# Patient Record
Sex: Female | Born: 1985 | Race: Black or African American | Hispanic: No | Marital: Single | Smoking: Never smoker
Health system: Southern US, Community
[De-identification: ages and names within clinical notes are randomized; demographics above are authoritative.]

## PROBLEM LIST (undated history)

## (undated) DIAGNOSIS — F319 Bipolar disorder, unspecified: Secondary | ICD-10-CM

## (undated) DIAGNOSIS — F329 Major depressive disorder, single episode, unspecified: Secondary | ICD-10-CM

## (undated) DIAGNOSIS — F909 Attention-deficit hyperactivity disorder, unspecified type: Secondary | ICD-10-CM

## (undated) DIAGNOSIS — F429 Obsessive-compulsive disorder, unspecified: Secondary | ICD-10-CM

## (undated) DIAGNOSIS — J45909 Unspecified asthma, uncomplicated: Secondary | ICD-10-CM

## (undated) DIAGNOSIS — F70 Mild intellectual disabilities: Secondary | ICD-10-CM

## (undated) DIAGNOSIS — R569 Unspecified convulsions: Secondary | ICD-10-CM

## (undated) DIAGNOSIS — K219 Gastro-esophageal reflux disease without esophagitis: Secondary | ICD-10-CM

## (undated) DIAGNOSIS — F32A Depression, unspecified: Secondary | ICD-10-CM

## (undated) DIAGNOSIS — Q909 Down syndrome, unspecified: Secondary | ICD-10-CM

## (undated) HISTORY — PX: EXAMINATION UNDER ANESTHESIA: SHX1540

## (undated) HISTORY — DX: Mild intellectual disabilities: F70

## (undated) HISTORY — DX: Down syndrome, unspecified: Q90.9

## (undated) HISTORY — DX: Attention-deficit hyperactivity disorder, unspecified type: F90.9

---
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
## 2006-10-14 ENCOUNTER — Ambulatory Visit (HOSPITAL_COMMUNITY): Admission: RE | Admit: 2006-10-14 | Discharge: 2006-10-14 | Payer: Self-pay | Admitting: Obstetrics & Gynecology

## 2006-12-14 ENCOUNTER — Emergency Department (HOSPITAL_COMMUNITY): Admission: EM | Admit: 2006-12-14 | Discharge: 2006-12-14 | Payer: Self-pay | Admitting: Family Medicine

## 2007-01-10 ENCOUNTER — Ambulatory Visit: Payer: Self-pay | Admitting: Obstetrics & Gynecology

## 2007-01-31 ENCOUNTER — Ambulatory Visit: Payer: Self-pay | Admitting: *Deleted

## 2007-02-04 ENCOUNTER — Ambulatory Visit (HOSPITAL_COMMUNITY): Admission: RE | Admit: 2007-02-04 | Discharge: 2007-02-04 | Payer: Self-pay | Admitting: Obstetrics & Gynecology

## 2007-02-04 ENCOUNTER — Encounter (INDEPENDENT_AMBULATORY_CARE_PROVIDER_SITE_OTHER): Payer: Self-pay | Admitting: Specialist

## 2007-02-04 ENCOUNTER — Ambulatory Visit: Payer: Self-pay | Admitting: Obstetrics & Gynecology

## 2007-06-06 ENCOUNTER — Ambulatory Visit: Payer: Self-pay | Admitting: Gynecology

## 2008-09-23 ENCOUNTER — Ambulatory Visit (HOSPITAL_COMMUNITY): Admission: RE | Admit: 2008-09-23 | Discharge: 2008-09-23 | Payer: Self-pay | Admitting: Obstetrics & Gynecology

## 2008-09-23 ENCOUNTER — Encounter: Payer: Self-pay | Admitting: Obstetrics & Gynecology

## 2008-09-23 ENCOUNTER — Ambulatory Visit: Payer: Self-pay | Admitting: Obstetrics & Gynecology

## 2009-10-28 ENCOUNTER — Emergency Department (HOSPITAL_COMMUNITY): Admission: EM | Admit: 2009-10-28 | Discharge: 2009-10-28 | Payer: Self-pay | Admitting: Emergency Medicine

## 2009-11-08 ENCOUNTER — Emergency Department (HOSPITAL_COMMUNITY): Admission: EM | Admit: 2009-11-08 | Discharge: 2009-11-08 | Payer: Self-pay | Admitting: Family Medicine

## 2011-02-05 LAB — CBC
HCT: 38.4 % (ref 36.0–46.0)
Hemoglobin: 13 g/dL (ref 12.0–15.0)
MCHC: 33.8 g/dL (ref 30.0–36.0)
MCV: 94.5 fL (ref 78.0–100.0)
Platelets: ADEQUATE 10*3/uL (ref 150–400)
RBC: 4.06 MIL/uL (ref 3.87–5.11)
RDW: 14.2 % (ref 11.5–15.5)
WBC: 8.1 10*3/uL (ref 4.0–10.5)

## 2011-02-05 LAB — DIFFERENTIAL
Basophils Absolute: 0 10*3/uL (ref 0.0–0.1)
Eosinophils Absolute: 0 10*3/uL (ref 0.0–0.7)
Lymphs Abs: 1.8 10*3/uL (ref 0.7–4.0)
Monocytes Absolute: 0.3 10*3/uL (ref 0.1–1.0)
Neutrophils Relative %: 74 % (ref 43–77)
WBC Morphology: INCREASED

## 2011-02-05 LAB — POCT I-STAT, CHEM 8
Calcium, Ion: 0.95 mmol/L — ABNORMAL LOW (ref 1.12–1.32)
Hemoglobin: 14.3 g/dL (ref 12.0–15.0)
Sodium: 136 mEq/L (ref 135–145)
TCO2: 30 mmol/L (ref 0–100)

## 2011-02-05 LAB — POTASSIUM: Potassium: 4.1 mEq/L (ref 3.5–5.1)

## 2011-02-16 ENCOUNTER — Encounter (INDEPENDENT_AMBULATORY_CARE_PROVIDER_SITE_OTHER): Payer: Medicaid Other | Admitting: Family

## 2011-02-16 DIAGNOSIS — Z3040 Encounter for surveillance of contraceptives, unspecified: Secondary | ICD-10-CM

## 2011-02-17 NOTE — Progress Notes (Signed)
NAME:  Hilario, Makaleigh             ACCOUNT NO.:  [id number]  MEDICAL RECORD NO.:  [id number]           PATIENT TYPE:  A  LOCATION:  [facility]                   FACILITY:  [facility]  PHYSICIAN:  Sid Falcon, CNM  DATE OF BIRTH:  1986/09/06  DATE OF SERVICE:  02/16/2011                                 CLINIC NOTE  REASON FOR VISIT:  The patient is here for refill of Jolessa (oral contraceptive).  It is documented in the chart that the patient has been trying to obtain this, has been unsuccessful, so scheduled an appointment to get a prescription.  The patient's last Pap smear was in November 2009.  Due to the complications of Pap smear and the fact that she has a negative HPV high risk.  I was decided that she able to have Paps q.3 years.  Next Pap smear will be due in November 2012 which needs to be completed in the OR due to mental status (developmentally delayed).  PLAN:  The patient's prescription for Jolessa 1 p.o. daily continuously written.  The patient instructed to start with information given to the staff worker, began pills on Sunday and just takes 1 pill daily.  Backup plans are not necessary due to statement by the patient is not sexually active and is to provided in the group home.     Sid Falcon, CNM    WM/MEDQ  D:  02/16/2011  T:  02/16/2011  Job:  [id number]

## 2011-03-20 NOTE — Op Note (Signed)
NAME:  Margaret Cooper, Margaret Cooper             ACCOUNT NO.:  [id number]   MEDICAL RECORD NO.:  [id number]          PATIENT TYPE:  AMB   LOCATION:  [facility]                           FACILITY:  [facility]   PHYSICIAN:  Lesly Dukes, M.D. DATE OF BIRTH:  [birth year]   DATE OF PROCEDURE:  DATE OF DISCHARGE:                               OPERATIVE REPORT   PREOPERATIVE DIAGNOSIS:  The patient is a 25 year old female in need of  a Pap smear, unable to tolerate exam in the office due to mental  retardation.   POSTOPERATIVE DIAGNOSIS:  The patient is a 25 year old female in need of  a Pap smear, unable to tolerate exam in the office due to mental  retardation.   PROCEDURE:  Pap smear with HPV typing.   SURGEON:  Lesly Dukes, MD   ANESTHESIA:  MAC.   FINDINGS:  Small nulliparous cervix, virginal introitus, small uterus,  no masses.   SPECIMENS:  1. Pap smear plus HPV typing.  2. Cytology.   ESTIMATED BLOOD LOSS:  None.   COMPLICATIONS:  None.   PROCEDURE:  After informed consent was obtained from her mother, the  patient was taken to the operating room and MAC anesthesia was induced.  The patient was placed in dorsal lithotomy position and a Pap smear was  obtained with the spatula and brush.  Bimanual was then performed with  the above findings.  The patient tolerated the procedure well, went to  the recovery room in stable condition.  All counts correct x2.      Lesly Dukes, M.D.  Electronically Signed     KHL/MEDQ  D:  09/23/2008  T:  09/24/2008  Job:  [id number]

## 2011-03-23 NOTE — Op Note (Signed)
NAME:  Margaret Cooper, Margaret Cooper             ACCOUNT NO.:  [id number]   MEDICAL RECORD NO.:  [id number]          PATIENT TYPE:  AMB   LOCATION:                                [facility]ACILITY[facility]  [facility]   PHYSICIAN:  Allie Bossier, MD        DATE OF BIRTH:  July 20, 1986   DATE OF PROCEDURE:  DATE OF DISCHARGE:  02/04/2007                               OPERATIVE REPORT   PREOPERATIVE DIAGNOSIS:  Need for Pap smear and breast examination,  mental retardation with Down's.   POSTOPERATIVE DIAGNOSIS:  Need for Pap smear and breast examination,  mental retardation with Down's.   PROCEDURE:  Examination under anesthesia, breast exam, and Pap smear.   SURGEON:  Allie Bossier, M.D.   ANESTHESIA:  MAC, Raul Del, M.D.   COMPLICATIONS:  None.   ESTIMATED BLOOD LOSS:  None.   SPECIMENS:  Pap smear.   PERTINENT FINDINGS:  Virginal introitus.  Negative bimanual exam.   DESCRIPTION OF PROCEDURE/FINDINGS:  Consents were signed by the  appropriate people.  She was given some preliminary IV sedation in the  preop area and in the operating room.  MAC anesthesia was given.  She  was placed in the dorsal lithotomy position.  Her vulva was inspected  and noted to be normal and virginal.  Using a small Pederson speculum,  the cervix was visualized.  Pap smear was obtained.  No visible lesions  were noted.  A bimanual exam was negative for any masses.  Her uterus  was normal in size and shape.  A breast exam was then done.  She was  awakened from her anesthesia and taken to the recovery room in stable  condition.      Allie Bossier, MD  Electronically Signed     MCD/MEDQ  D:  02/04/2007  T:  02/04/2007  Job:  [id number]

## 2011-08-07 LAB — CBC
MCHC: 33.6
Platelets: 205
RDW: 15.1

## 2011-09-21 ENCOUNTER — Ambulatory Visit (INDEPENDENT_AMBULATORY_CARE_PROVIDER_SITE_OTHER): Payer: Medicaid Other | Admitting: Obstetrics & Gynecology

## 2011-09-21 ENCOUNTER — Encounter: Payer: Self-pay | Admitting: Obstetrics & Gynecology

## 2011-09-21 VITALS — BP 113/72 | HR 101 | Temp 97.7°F | Ht 60.0 in | Wt 183.6 lb

## 2011-09-21 DIAGNOSIS — Z01818 Encounter for other preprocedural examination: Secondary | ICD-10-CM

## 2011-09-21 DIAGNOSIS — Q909 Down syndrome, unspecified: Secondary | ICD-10-CM

## 2011-09-21 NOTE — Progress Notes (Signed)
History:  25 y.o. G0P0000 here today to schedule for pap smear to be done in the OR.  She has Down's syndrome, and has been followed in this clinic for several years.  As of her last visit, it was decided that she will need paps every 3 years in the OR since she has no history of cervical dysplasia.  Last pap in 2009 was negative.  Patient is here with her advocate, Joneen Roach, who reports that the patient reported vulvar irritation but nothing was seen on exam.  She has used OTC antifungals but the patient still reports having pruritus.   No other symptoms reported by the patient or her advocate.  Patient is not sexually active.  The following portions of the patient's history were reviewed and updated as appropriate: allergies, current medications, past family history, past medical history, past social history, past surgical history and problem list.   Objective:  Physical Exam Blood pressure 113/72, pulse 101, temperature 97.7 F (36.5 C), temperature source Oral, height 5' (1.524 m), weight 183 lb 9.6 oz (83.28 kg), last menstrual period 09/07/2011. Exam deferred as per patient preference and limitations  Assessment & Plan:  Will schedule for exam under anesthesia and pap smear in the OR.  Patient will be contacted with date and time of exam. Return to clinic for any gynecologic concerns.

## 2011-09-21 NOTE — Patient Instructions (Signed)

## 2011-10-01 ENCOUNTER — Emergency Department (HOSPITAL_COMMUNITY)
Admission: EM | Admit: 2011-10-01 | Discharge: 2011-10-02 | Disposition: A | Discharge status: Rehabilitation Facility | Payer: Medicaid Other | Attending: Emergency Medicine | Admitting: Emergency Medicine

## 2011-10-01 ENCOUNTER — Encounter (HOSPITAL_COMMUNITY): Payer: Self-pay | Admitting: Emergency Medicine

## 2011-10-01 DIAGNOSIS — F70 Mild intellectual disabilities: Secondary | ICD-10-CM | POA: Insufficient documentation

## 2011-10-01 DIAGNOSIS — R109 Unspecified abdominal pain: Secondary | ICD-10-CM | POA: Insufficient documentation

## 2011-10-01 DIAGNOSIS — F988 Other specified behavioral and emotional disorders with onset usually occurring in childhood and adolescence: Secondary | ICD-10-CM | POA: Insufficient documentation

## 2011-10-01 DIAGNOSIS — Q909 Down syndrome, unspecified: Secondary | ICD-10-CM | POA: Insufficient documentation

## 2011-10-01 DIAGNOSIS — Z79899 Other long term (current) drug therapy: Secondary | ICD-10-CM | POA: Insufficient documentation

## 2011-10-01 DIAGNOSIS — R1110 Vomiting, unspecified: Secondary | ICD-10-CM | POA: Insufficient documentation

## 2011-10-01 LAB — CBC
MCH: 31.1 pg (ref 26.0–34.0)
MCHC: 32.4 g/dL (ref 30.0–36.0)
MCV: 96.1 fL (ref 78.0–100.0)
Platelets: 321 10*3/uL (ref 150–400)
RBC: 4.08 MIL/uL (ref 3.87–5.11)
RDW: 14.9 % (ref 11.5–15.5)

## 2011-10-01 LAB — DIFFERENTIAL
Basophils Absolute: 0.1 10*3/uL (ref 0.0–0.1)
Basophils Relative: 1 % (ref 0–1)
Eosinophils Absolute: 0.1 10*3/uL (ref 0.0–0.7)
Eosinophils Relative: 1 % (ref 0–5)
Neutrophils Relative %: 48 % (ref 43–77)

## 2011-10-01 NOTE — ED Notes (Signed)
Caregiver reports pt.s' abdominal pain and vomitting this evening .

## 2011-10-02 ENCOUNTER — Encounter (HOSPITAL_COMMUNITY): Payer: Self-pay | Admitting: Emergency Medicine

## 2011-10-02 LAB — URINALYSIS, ROUTINE W REFLEX MICROSCOPIC
Bilirubin Urine: NEGATIVE
Hgb urine dipstick: NEGATIVE
Nitrite: NEGATIVE
Protein, ur: NEGATIVE mg/dL
Specific Gravity, Urine: 1.028 (ref 1.005–1.030)
Urobilinogen, UA: 0.2 mg/dL (ref 0.0–1.0)

## 2011-10-02 LAB — BASIC METABOLIC PANEL
BUN: 11 mg/dL (ref 6–23)
Calcium: 8.7 mg/dL (ref 8.4–10.5)
GFR calc Af Amer: 79 mL/min — ABNORMAL LOW (ref >90)
GFR calc non Af Amer: 68 mL/min — ABNORMAL LOW (ref >90)
Glucose, Bld: 106 mg/dL — ABNORMAL HIGH (ref 70–99)
Potassium: 3.7 mEq/L (ref 3.5–5.1)
Sodium: 144 mEq/L (ref 135–145)

## 2011-10-02 LAB — URINE MICROSCOPIC-ADD ON

## 2011-10-02 LAB — POCT PREGNANCY, URINE: Preg Test, Ur: NEGATIVE

## 2011-10-02 MED ORDER — PROMETHAZINE HCL 25 MG PO TABS: 25.0000 mg | ORAL_TABLET | Freq: Four times a day (QID) | ORAL | Status: AC | PRN

## 2011-10-02 NOTE — ED Provider Notes (Signed)
History    this is a patient with history of Down syndrome who is presenting to the ED today with her caregiver due to the chief complaint of abdominal pain and vomiting. Per caregiver, patient stays at a group home. Over the weekend, she returns to the family home for the holiday. When returning back to the group home last night, patient complains of diffuse abdominal pain and had vomited 3 times. Patient denies fever, headache, chest pain, cough, shortness of breath, back pain, dysuria.  CSN: [id number] Arrival date & time: 10/01/2011 10:50 PM   First MD Initiated Contact with Patient 10/02/11 0600      Chief Complaint  Patient presents with  . Abdominal Pain    (Consider location/radiation/quality/duration/timing/severity/associated sxs/prior treatment) Patient is a 25 y.o. female presenting with vomiting. The history is provided by the patient and a caregiver. The history is limited by a developmental delay. No language interpreter was used.  Emesis  This is a new problem. The current episode started 6 to 12 hours ago. The problem occurs 2 to 4 times per day. The problem has been resolved. The emesis has an appearance of stomach contents. There has been no fever. Associated symptoms include abdominal pain. Pertinent negatives include no chills, no cough, no diarrhea, no fever and no URI. Risk factors include suspect food intake.    Past Medical History  Diagnosis Date  . Down's syndrome   . ADD (attention deficit disorder with hyperactivity)   . Mild mental retardation     History reviewed. No pertinent past surgical history.  No family history on file.  History  Substance Use Topics  . Smoking status: Never Smoker   . Smokeless tobacco: Never Used  . Alcohol Use: No    OB History    Grav Para Term Preterm Abortions TAB SAB Ect Mult Living   0 0 0 0 0 0 0 0 0 0       Review of Systems  Constitutional: Negative for fever and chills.  Respiratory: Negative for cough.     Gastrointestinal: Positive for vomiting and abdominal pain. Negative for diarrhea.  All other systems reviewed and are negative.    Allergies  Review of patient's allergies indicates no known allergies.  Home Medications   Current Outpatient Rx  Name Route Sig Dispense Refill  . AMPHETAMINE-DEXTROAMPHETAMINE 15 MG PO TABS Oral Take 15 mg by mouth daily.      . ATOMOXETINE HCL 40 MG PO CAPS Oral Take 40 mg by mouth daily.     Marland Kitchen LEVONORGEST-ETH ESTRAD 91-DAY 0.15-0.03 MG PO TABS Oral Take 1 tablet by mouth daily.      Marland Kitchen LORAZEPAM 1 MG PO TABS Oral Take 1 mg by mouth every 8 (eight) hours.      Marland Kitchen OVER THE COUNTER MEDICATION Apply externally Apply topically. Gold Band lotion apply BID     . QUETIAPINE FUMARATE 400 MG PO TABS Oral Take 400 mg by mouth at bedtime.      Marland Kitchen VALPROIC ACID 250 MG/5ML PO SYRP Oral Take 500-1,000 mg by mouth 2 (two) times daily. Take 1000mg  (4 teaspoons) in am and 500mg  (2 teaspoons) every night     . HYDROXYZINE HCL 25 MG PO TABS Oral Take 25 mg by mouth 2 (two) times daily as needed. For itching       BP 110/71  Pulse 134  Temp(Src) 98.2 F (36.8 C) (Oral)  Resp 18  SpO2 100%  LMP 09/07/2011  Physical Exam  Nursing note  and vitals reviewed. Constitutional:       Awake, alert, nontoxic appearance  HENT:  Head: Atraumatic.  Mouth/Throat: Oropharynx is clear and moist.  Eyes: Right eye exhibits no discharge. Left eye exhibits no discharge.  Neck: Neck supple.  Pulmonary/Chest: Effort normal. She exhibits no tenderness.  Abdominal: Soft. Normal appearance. There is no tenderness. There is no rigidity, no rebound, no guarding, no CVA tenderness, no tenderness at McBurney's point and negative Murphy's sign. No hernia. Hernia confirmed negative in the ventral area.  Musculoskeletal: She exhibits no tenderness.       Baseline ROM, no obvious new focal weakness  Neurological:       Mental status and motor strength appears baseline for patient and situation   Skin: No rash noted.  Psychiatric: She has a normal mood and affect.    ED Course  Procedures (including critical care time)  Labs Reviewed  BASIC METABOLIC PANEL - Abnormal; Notable for the following:    Glucose, Bld 106 (*)    GFR calc non Af Amer 68 (*)    GFR calc Af Amer 79 (*)    All other components within normal limits  CBC  DIFFERENTIAL  URINALYSIS, ROUTINE W REFLEX MICROSCOPIC  POCT PREGNANCY, URINE   No results found.   No diagnosis found.    MDM  Patient has a restful sleep throughout the night in the. No documented bouts of vomiting in ED. Abdomen is benign, no McBurney point or Murphy sign. CBC and BMP shows no electrolyte imbalance, or signs of infection. UA and upreg is negative.  Pt request to go home.  Will discharge with recommendation to f/u with PCP.    7:50 AM Pt able to tolerates PO without difficulty.  Appears nontoxic.  Will discharge.         Fayrene Helper, [place] 10/02/11 [phone]

## 2011-10-02 NOTE — ED Notes (Signed)
MD at bedside; Pt still drowsy. Caregiver at bedside.

## 2011-10-02 NOTE — ED Notes (Signed)
Pt drinking diet coke without difficulty, denies abdominal pain at this time

## 2011-10-02 NOTE — ED Notes (Signed)
Pt states she is unable to void.

## 2011-10-02 NOTE — ED Notes (Signed)
Pt refused to open eyes and communicate with RN. RN reported she will be back when pt is ready to speak. VSS; caregiver at bedside.

## 2011-10-02 NOTE — ED Notes (Signed)
Patient awake and says stomach does not hurt anymore. Urine sample provided. MD made aware.

## 2011-10-03 NOTE — ED Provider Notes (Signed)
Medical screening examination/treatment/procedure(s) were performed by non-physician practitioner and as supervising physician I was immediately available for consultation/collaboration.  Nicholes Stairs, MD 10/03/11 [phone]

## 2011-10-25 ENCOUNTER — Encounter (HOSPITAL_COMMUNITY): Payer: Self-pay | Admitting: Pharmacist

## 2011-11-08 ENCOUNTER — Encounter (HOSPITAL_COMMUNITY): Payer: Self-pay

## 2011-11-08 ENCOUNTER — Encounter (HOSPITAL_COMMUNITY)
Admission: RE | Admit: 2011-11-08 | Discharge: 2011-11-08 | Disposition: A | Discharge status: Home / Self Care | Payer: Medicaid Other | Source: Ambulatory Visit | Attending: Obstetrics & Gynecology | Admitting: Obstetrics & Gynecology

## 2011-11-08 LAB — CBC
MCHC: 32.2 g/dL (ref 30.0–36.0)
RDW: 14 % (ref 11.5–15.5)

## 2011-11-08 NOTE — Patient Instructions (Signed)
   Your procedure is scheduled on: Wednesday January 9th  Enter through the [employer] [facility] at: 7am Pick up the phone at the desk and dial [phone] and inform us of your arrival.  Please call this number if you have any problems the morning of surgery: [phone]  Remember: Do not eat food after midnight: Tuesday Do not drink clear liquids after: midnight Tuesday Take these medicines the morning of surgery with a SIP OF WATER: morning meds  Do not wear jewelry, make-up, or FINGER nail polish Do not wear lotions, powders, perfumes or deodorant. Do not shave 48 hours prior to surgery. Do not bring valuables to the hospital.    Patients discharged on the day of surgery will not be allowed to drive home.     Remember to use your hibiclens as instructed.Please shower with 1/2 bottle the evening before your surgery and the other 1/2 bottle the morning of surgery.

## 2011-11-08 NOTE — Pre-Procedure Instructions (Signed)
Spoke with Dr Rudene Christians to take Strattera, Valproic Acid, and Lorazepam am of surgery with sips as prescribed. Pt ok to be seen lsd

## 2011-11-14 ENCOUNTER — Encounter (HOSPITAL_COMMUNITY): Payer: Self-pay | Admitting: Anesthesiology

## 2011-11-14 ENCOUNTER — Encounter (HOSPITAL_COMMUNITY)
Admission: RE | Disposition: A | Discharge status: Home / Self Care | Payer: Self-pay | Source: Ambulatory Visit | Attending: Obstetrics & Gynecology

## 2011-11-14 ENCOUNTER — Ambulatory Visit (HOSPITAL_COMMUNITY): Payer: Medicaid Other | Admitting: Anesthesiology

## 2011-11-14 ENCOUNTER — Other Ambulatory Visit: Payer: Self-pay | Admitting: Obstetrics & Gynecology

## 2011-11-14 ENCOUNTER — Ambulatory Visit (HOSPITAL_COMMUNITY)
Admission: RE | Admit: 2011-11-14 | Discharge: 2011-11-14 | Disposition: A | Discharge status: Home / Self Care | Payer: Medicaid Other | Source: Ambulatory Visit | Attending: Obstetrics & Gynecology | Admitting: Obstetrics & Gynecology

## 2011-11-14 ENCOUNTER — Encounter (HOSPITAL_COMMUNITY): Payer: Self-pay | Admitting: *Deleted

## 2011-11-14 DIAGNOSIS — F70 Mild intellectual disabilities: Secondary | ICD-10-CM | POA: Insufficient documentation

## 2011-11-14 DIAGNOSIS — Q909 Down syndrome, unspecified: Secondary | ICD-10-CM | POA: Insufficient documentation

## 2011-11-14 DIAGNOSIS — F988 Other specified behavioral and emotional disorders with onset usually occurring in childhood and adolescence: Secondary | ICD-10-CM | POA: Insufficient documentation

## 2011-11-14 DIAGNOSIS — Z1151 Encounter for screening for human papillomavirus (HPV): Secondary | ICD-10-CM | POA: Insufficient documentation

## 2011-11-14 DIAGNOSIS — Z01419 Encounter for gynecological examination (general) (routine) without abnormal findings: Secondary | ICD-10-CM | POA: Insufficient documentation

## 2011-11-14 HISTORY — PX: EXAMINATION UNDER ANESTHESIA: SHX1540

## 2011-11-14 LAB — PREGNANCY, URINE: Preg Test, Ur: NEGATIVE

## 2011-11-14 SURGERY — EXAM UNDER ANESTHESIA
Anesthesia: Monitor Anesthesia Care | Site: Vagina | Wound class: Clean Contaminated

## 2011-11-14 MED ORDER — FENTANYL CITRATE 0.05 MG/ML IJ SOLN
INTRAMUSCULAR | Status: DC | PRN
  Administered 2011-11-14 (×2): 50 ug via INTRAVENOUS

## 2011-11-14 MED ORDER — PROPOFOL 10 MG/ML IV EMUL
INTRAVENOUS | Status: DC | PRN
  Administered 2011-11-14: 10 mg via INTRAVENOUS
  Administered 2011-11-14: 20 mg via INTRAVENOUS
  Administered 2011-11-14: 10 mg via INTRAVENOUS
  Administered 2011-11-14 (×2): 20 mg via INTRAVENOUS
  Administered 2011-11-14: 10 mg via INTRAVENOUS

## 2011-11-14 MED ORDER — PROPOFOL 10 MG/ML IV EMUL
INTRAVENOUS | Status: AC
  Filled 2011-11-14: qty 20

## 2011-11-14 MED ORDER — MEPERIDINE HCL 25 MG/ML IJ SOLN
6.2500 mg | INTRAMUSCULAR | Status: DC | PRN
Start: 2011-11-14 — End: 2011-11-14

## 2011-11-14 MED ORDER — FENTANYL CITRATE 0.05 MG/ML IJ SOLN
INTRAMUSCULAR | Status: AC
  Filled 2011-11-14: qty 5

## 2011-11-14 MED ORDER — ONDANSETRON HCL 4 MG/2ML IJ SOLN
INTRAMUSCULAR | Status: DC | PRN
  Administered 2011-11-14: 4 mg via INTRAVENOUS

## 2011-11-14 MED ORDER — DEXAMETHASONE SODIUM PHOSPHATE 10 MG/ML IJ SOLN
INTRAMUSCULAR | Status: AC
  Filled 2011-11-14: qty 1

## 2011-11-14 MED ORDER — ONDANSETRON HCL 4 MG/2ML IJ SOLN: 4.0000 mg | Freq: Once | INTRAMUSCULAR | Status: DC | PRN

## 2011-11-14 MED ORDER — GLYCOPYRROLATE 0.2 MG/ML IJ SOLN
INTRAMUSCULAR | Status: AC
  Filled 2011-11-14: qty 1

## 2011-11-14 MED ORDER — FENTANYL CITRATE 0.05 MG/ML IJ SOLN: 25.0000 ug | INTRAMUSCULAR | Status: DC | PRN

## 2011-11-14 MED ORDER — KETOROLAC TROMETHAMINE 30 MG/ML IJ SOLN: 15.0000 mg | Freq: Once | INTRAMUSCULAR | Status: DC | PRN

## 2011-11-14 MED ORDER — ONDANSETRON HCL 4 MG/2ML IJ SOLN
INTRAMUSCULAR | Status: AC
  Filled 2011-11-14: qty 2

## 2011-11-14 MED ORDER — MIDAZOLAM HCL 2 MG/2ML IJ SOLN
INTRAMUSCULAR | Status: AC
  Filled 2011-11-14: qty 2

## 2011-11-14 MED ORDER — LIDOCAINE HCL (CARDIAC) 20 MG/ML IV SOLN
INTRAVENOUS | Status: DC | PRN
  Administered 2011-11-14: 50 mg via INTRAVENOUS

## 2011-11-14 MED ORDER — LIDOCAINE HCL (CARDIAC) 20 MG/ML IV SOLN
INTRAVENOUS | Status: AC
  Filled 2011-11-14: qty 5

## 2011-11-14 MED ORDER — LACTATED RINGERS IV SOLN
INTRAVENOUS | Status: DC
  Administered 2011-11-14 (×2): via INTRAVENOUS

## 2011-11-14 MED ORDER — MIDAZOLAM HCL 5 MG/5ML IJ SOLN
INTRAMUSCULAR | Status: DC | PRN
  Administered 2011-11-14 (×2): 1 mg via INTRAVENOUS

## 2011-11-14 NOTE — Anesthesia Postprocedure Evaluation (Signed)
Anesthesia Post Note  Patient: Margaret Cooper  Procedure(s) Performed:  [name] UNDER ANESTHESIA - Pap smear/down syndrome patient  Anesthesia type: MAC  Patient location: PACU  Post pain: Pain level controlled  Post assessment: Post-op Vital signs reviewed  Last Vitals:  Filed Vitals:   11/14/11 0930  BP: 104/55  Pulse: 100  Temp:   Resp: 16    Post vital signs: Reviewed  Level of consciousness: sedated  Complications: No apparent anesthesia complications

## 2011-11-14 NOTE — Op Note (Addendum)
PREOPERATIVE DIAGNOSIS: 26 year old G0 female with Down's Syndrome in need of a Pap smear and pelvic exam, unable to tolerate exam in the office. POSTOPERATIVE DIAGNOSIS: The same PROCEDURE: Pap smear with HPV typing and ancillary testing.  SURGEON: Jaynie Collins, MD ANESTHESIA: MAC.  FINDINGS:  Virginal introitus, friable with Pederson speculum placement.  Small nulliparous cervix, also with small amount of bleeding after pap smear.  No vaginal lesions, small uterus, no masses.  SPECIMENS: Pap smear cytology plus HPV typing and ancillary testing.  ESTIMATED BLOOD LOSS: 5 ml.  COMPLICATIONS: None.  PROCEDURE: After written informed consent was obtained from her mother, the patient was taken to the operating room and MAC anesthesia was induced. The patient was placed in dorsal lithotomy position, a Pederson speculum was placed and the findings above were observed.  A Pap smear was obtained with the spatula and brush. A bimanual exam was negative for any abnormal masses.  The patient tolerated the procedure well, went to the recovery room in stable condition. All counts correct x2.   Jaynie Collins, M.D. 11/14/2011 9:09 AM

## 2011-11-14 NOTE — Transfer of Care (Signed)
Immediate Anesthesia Transfer of Care Note  Patient: Margaret Cooper  Procedure(s) Performed:  [name] UNDER ANESTHESIA - Pap smear/down syndrome patient  Patient Location: PACU  Anesthesia Type: General  Level of Consciousness: awake  Airway & Oxygen Therapy: Patient Spontanous Breathing and Patient connected to nasal cannula oxygen  Post-op Assessment: Report given to PACU RN and Post -op Vital signs reviewed and stable  Post vital signs: Reviewed and stable  Complications: No apparent anesthesia complications

## 2011-11-14 NOTE — H&P (Signed)
Preoperative History and Physical  Margaret Cooper is a 26 y.o. G0P0000 with history of Down's Syndrome and inability to tolerate office pelvic exams, here for exam under anesthesia, pap smear.  Patient's also reports vulva itching, will evaluate and do appropriate tests.  PMH:    Past Medical History  Diagnosis Date  . ADD (attention deficit disorder with hyperactivity)   . Mild mental retardation   . Down's syndrome    PSH:     Past Surgical History  Procedure Date  . Examination under anesthesia 2008,2009   POb/GynH:   OB History    Grav Para Term Preterm Abortions TAB SAB Ect Mult Living   0 0 0 0 0 0 0 0 0 0       Medications: Current facility-administered medications:lactated ringers infusion, , Intravenous, Continuous, Tereso Newcomer, MD, Last Rate: 125 mL/hr at 11/14/11 0703  Allergies: No Known Allergies  SH:   History  Substance Use Topics  . Smoking status: Never Smoker   . Smokeless tobacco: Never Used  . Alcohol Use: No    FH:   History reviewed. No pertinent family history.  Review of Systems: Not indicated; not relevant to the procedure  PHYSICAL EXAM: Blood pressure 110/65, pulse 111, temperature 98.1 F (36.7 C), temperature source Oral, resp. rate 18, SpO2 97.00%. General appearance - alert, well appearing, and in no distress Chest - clear to auscultation, no wheezes, rales or rhonchi, symmetric air entry Heart - normal rate and regular rhythm Abdomen - soft, nontender, nondistended, no masses or organomegaly Extremities - peripheral pulses normal, no pedal edema, no clubbing or cyanosis  Labs: Recent Results (from the past 336 hour(s))  CBC   Collection Time   11/08/11 10:05 AM      Component Value Range   WBC 9.6  4.0 - 10.5 (K/uL)   RBC 3.75 (*) 3.87 - 5.11 (MIL/uL)   Hemoglobin 11.7 (*) 12.0 - 15.0 (g/dL)   HCT 29.5  62.1 - 30.8 (%)   MCV 96.8  78.0 - 100.0 (fL)   MCH 31.2  26.0 - 34.0 (pg)   MCHC 32.2  30.0 - 36.0 (g/dL)   RDW 65.7   84.6 - 96.2 (%)   Platelets 238  150 - 400 (K/uL)  PREGNANCY, URINE   Collection Time   11/14/11  6:45 AM      Component Value Range   Preg Test, Ur NEGATIVE     Assessment: Patient Active Problem List  Diagnoses  . Down's syndrome  Unable to tolerate office exam  Plan: Exam under anesthesia Pap smear +HPV.  If negative, will repeat in 3 years. Wet prep Any other appropriate tests To OR when ready.  Jaynie Collins, M.D. 11/14/2011 7:58 AM

## 2011-11-14 NOTE — Anesthesia Preprocedure Evaluation (Signed)
Anesthesia Evaluation  Patient identified by MRN, date of birth, ID band Patient awake    Reviewed: Allergy & Precautions, H&P , NPO status , Patient's Chart, lab work & pertinent test results, reviewed documented beta blocker date and time   History of Anesthesia Complications Negative for: history of anesthetic complications  Airway Mallampati: III TM Distance: >3 FB Neck ROM: full    Dental  (+) Teeth Intact   Pulmonary neg pulmonary ROS,  clear to auscultation  Pulmonary exam normal       Cardiovascular neg cardio ROS regular Normal    Neuro/Psych PSYCHIATRIC DISORDERS (ADD) Down Syndrome    GI/Hepatic negative GI ROS, Neg liver ROS,   Endo/Other  Negative Endocrine ROS  Renal/GU negative Renal ROS  Genitourinary negative   Musculoskeletal   Abdominal   Peds  Hematology negative hematology ROS (+)   Anesthesia Other Findings   Reproductive/Obstetrics negative OB ROS                           Anesthesia Physical Anesthesia Plan  ASA: II  Anesthesia Plan: MAC   Post-op Pain Management:    Induction:   Airway Management Planned:   Additional Equipment:   Intra-op Plan:   Post-operative Plan:   Informed Consent: I have reviewed the patients History and Physical, chart, labs and discussed the procedure including the risks, benefits and alternatives for the proposed anesthesia with the patient or authorized representative who has indicated his/her understanding and acceptance.   Dental Advisory Given  Plan Discussed with: CRNA and Surgeon  Anesthesia Plan Comments:         Anesthesia Quick Evaluation

## 2011-11-15 ENCOUNTER — Encounter (HOSPITAL_COMMUNITY): Payer: Self-pay | Admitting: Obstetrics & Gynecology

## 2011-12-03 ENCOUNTER — Telehealth: Payer: Self-pay | Admitting: *Deleted

## 2011-12-03 NOTE — Telephone Encounter (Signed)
Representative for [employer] calling for recent test results.

## 2011-12-04 NOTE — Telephone Encounter (Signed)
Call returned and message left for Cyndi Lennert- social worker @ the group home. I stated that all test results were negative/normal. Tests performed were Pap smear, GC/Chlamydia, wet prep for vaginal infections. Dr. Macon Large recommends repeat Pap in 3 yrs for Margaret Cooper. She may call back if she has further questions.

## 2012-01-16 ENCOUNTER — Encounter (HOSPITAL_COMMUNITY): Payer: Self-pay | Admitting: Pharmacy Technician

## 2012-01-16 ENCOUNTER — Encounter (HOSPITAL_COMMUNITY): Payer: Self-pay

## 2012-01-16 ENCOUNTER — Encounter (HOSPITAL_COMMUNITY)
Admission: RE | Admit: 2012-01-16 | Discharge: 2012-01-16 | Disposition: A | Discharge status: Home / Self Care | Payer: Medicaid Other | Source: Ambulatory Visit | Attending: Dentistry | Admitting: Dentistry

## 2012-01-16 HISTORY — DX: Obsessive-compulsive disorder, unspecified: F42.9

## 2012-01-16 NOTE — Progress Notes (Addendum)
Requesting labs from [facility]. Labs need to be collected under sedation per group home aide, so if repeat needed will have to be done DOS.   Per group home aide their social worker is obtaining consent for procedure.   Orders were requested this morning by scheduler Cicero Duck).

## 2012-01-16 NOTE — Pre-Procedure Instructions (Signed)
18 Margaret Cooper  01/16/2012   Your procedure is scheduled on:  Friday March 22  Report to [facility] at 8:20 AM.  Call this number if you have problems the morning of surgery: [phone]   Remember:   Do not eat food:After Midnight.  May have clear liquids: up to 4 Hours before arrival.  Clear liquids include soda, tea, black coffee, apple or grape juice, broth.  Take these medicines the morning of surgery with A SIP OF WATER: Ativan (Lorazepam), Depakene, Adderall, Strattera, birth control pill   Do not wear jewelry, make-up or nail polish.  Do not wear lotions, powders, or perfumes. You may wear deodorant.  Do not shave 48 hours prior to surgery.  Do not bring valuables to the hospital.  Contacts, dentures or bridgework may not be worn into surgery.  Leave suitcase in the car. After surgery it may be brought to your room.  For patients admitted to the hospital, checkout time is 11:00 AM the day of discharge.   Patients discharged the day of surgery will not be allowed to drive home.  Name and phone number of your driver: Group home aide  Special Instructions: CHG Shower Use Special Wash: 1/2 bottle night before surgery and 1/2 bottle morning of surgery.   Please read over the following fact sheets that you were given: Pain Booklet, Coughing and Deep Breathing and Surgical Site Infection Prevention

## 2012-01-17 ENCOUNTER — Other Ambulatory Visit (HOSPITAL_COMMUNITY): Payer: Medicaid Other

## 2012-01-21 NOTE — Progress Notes (Signed)
Spoke w/Dr. Janyce Llanos office manager, [phone]). Requested orders for Pt's procedure on 01/25/2012. Boyd Kerbs stated she would fax orders to [facility] Stay [facility].  Will await.//L. Ezmeralda Stefanick,RN

## 2012-01-25 ENCOUNTER — Encounter (HOSPITAL_COMMUNITY): Payer: Self-pay | Admitting: *Deleted

## 2012-01-25 ENCOUNTER — Encounter (HOSPITAL_COMMUNITY): Payer: Self-pay | Admitting: Vascular Surgery

## 2012-01-25 ENCOUNTER — Ambulatory Visit (HOSPITAL_COMMUNITY): Payer: Medicaid Other | Admitting: Anesthesiology

## 2012-01-25 ENCOUNTER — Encounter (HOSPITAL_COMMUNITY)
Admission: RE | Disposition: A | Discharge status: Home / Self Care | Payer: Self-pay | Source: Ambulatory Visit | Attending: Dentistry

## 2012-01-25 ENCOUNTER — Ambulatory Visit (HOSPITAL_COMMUNITY)
Admission: RE | Admit: 2012-01-25 | Discharge: 2012-01-25 | Disposition: A | Discharge status: Home / Self Care | Payer: Medicaid Other | Source: Ambulatory Visit | Attending: Dentistry | Admitting: Dentistry

## 2012-01-25 DIAGNOSIS — F909 Attention-deficit hyperactivity disorder, unspecified type: Secondary | ICD-10-CM | POA: Insufficient documentation

## 2012-01-25 DIAGNOSIS — F79 Unspecified intellectual disabilities: Secondary | ICD-10-CM | POA: Insufficient documentation

## 2012-01-25 DIAGNOSIS — F429 Obsessive-compulsive disorder, unspecified: Secondary | ICD-10-CM | POA: Insufficient documentation

## 2012-01-25 DIAGNOSIS — K029 Dental caries, unspecified: Secondary | ICD-10-CM | POA: Insufficient documentation

## 2012-01-25 HISTORY — PX: TOOTH EXTRACTION: SHX859

## 2012-01-25 LAB — CBC
MCV: 93.7 fL (ref 78.0–100.0)
Platelets: 277 10*3/uL (ref 150–400)
RDW: 15.9 % — ABNORMAL HIGH (ref 11.5–15.5)
WBC: 10.9 10*3/uL — ABNORMAL HIGH (ref 4.0–10.5)

## 2012-01-25 LAB — HCG, SERUM, QUALITATIVE: Preg, Serum: NEGATIVE

## 2012-01-25 SURGERY — DENTAL RESTORATION/EXTRACTIONS
Anesthesia: General | Site: Mouth | Laterality: Bilateral

## 2012-01-25 MED ORDER — LACTATED RINGERS IV SOLN
INTRAVENOUS | Status: DC | PRN
  Administered 2012-01-25: 15:00:00 via INTRAVENOUS

## 2012-01-25 MED ORDER — PHENYLEPHRINE HCL 10 MG/ML IJ SOLN
INTRAMUSCULAR | Status: DC | PRN
  Administered 2012-01-25 (×9): 80 ug via INTRAVENOUS

## 2012-01-25 MED ORDER — FENTANYL CITRATE 0.05 MG/ML IJ SOLN
INTRAMUSCULAR | Status: DC | PRN
  Administered 2012-01-25: 100 ug via INTRAVENOUS

## 2012-01-25 MED ORDER — SUCCINYLCHOLINE CHLORIDE 20 MG/ML IJ SOLN
INTRAMUSCULAR | Status: DC | PRN
  Administered 2012-01-25: 100 mg via INTRAVENOUS

## 2012-01-25 MED ORDER — MIDAZOLAM HCL 2 MG/ML PO SYRP
ORAL_SOLUTION | ORAL | Status: AC
  Filled 2012-01-25: qty 8

## 2012-01-25 MED ORDER — MIDAZOLAM HCL 2 MG/ML PO SYRP
16.0000 mg | ORAL_SOLUTION | Freq: Once | ORAL | Status: AC
  Administered 2012-01-25: 16 mg via ORAL

## 2012-01-25 MED ORDER — ONDANSETRON HCL 4 MG/2ML IJ SOLN
INTRAMUSCULAR | Status: DC | PRN
  Administered 2012-01-25: 4 mg via INTRAVENOUS

## 2012-01-25 MED ORDER — ROCURONIUM BROMIDE 100 MG/10ML IV SOLN
INTRAVENOUS | Status: DC | PRN
  Administered 2012-01-25: 30 mg via INTRAVENOUS

## 2012-01-25 MED ORDER — GLYCOPYRROLATE 0.2 MG/ML IJ SOLN
INTRAMUSCULAR | Status: DC | PRN
  Administered 2012-01-25: 0.6 mg via INTRAVENOUS

## 2012-01-25 MED ORDER — NEOSTIGMINE METHYLSULFATE 1 MG/ML IJ SOLN
INTRAMUSCULAR | Status: DC | PRN
  Administered 2012-01-25: 4 mg via INTRAVENOUS

## 2012-01-25 MED ORDER — LACTATED RINGERS IV SOLN
INTRAVENOUS | Status: DC
  Administered 2012-01-25: 14:00:00 via INTRAVENOUS

## 2012-01-25 MED ORDER — PROPOFOL 10 MG/ML IV EMUL
INTRAVENOUS | Status: DC | PRN
  Administered 2012-01-25: 200 mg via INTRAVENOUS

## 2012-01-25 MED ORDER — KETOROLAC TROMETHAMINE 30 MG/ML IJ SOLN
INTRAMUSCULAR | Status: DC | PRN
  Administered 2012-01-25: 30 mg via INTRAVENOUS

## 2012-01-25 SURGICAL SUPPLY — 39 items
BLADE SURG 15 STRL LF DISP TIS (BLADE) IMPLANT
BLADE SURG 15 STRL SS (BLADE)
CANISTER SUCTION 2500CC (MISCELLANEOUS) ×2 IMPLANT
CLOTH BEACON ORANGE TIMEOUT ST (SAFETY) ×2 IMPLANT
CONT SPEC 4OZ CLIKSEAL STRL BL (MISCELLANEOUS) IMPLANT
COVER PROBE W GEL 5X96 (DRAPES) ×2 IMPLANT
COVER SURGICAL LIGHT HANDLE (MISCELLANEOUS) ×2 IMPLANT
COVER TABLE BACK 60X90 (DRAPES) ×2 IMPLANT
DECANTER SPIKE VIAL GLASS SM (MISCELLANEOUS) IMPLANT
DRAPE PROXIMA HALF (DRAPES) ×2 IMPLANT
ELECT COATED BLADE 2.86 ST (ELECTRODE) IMPLANT
ELECT REM PT RETURN 9FT ADLT (ELECTROSURGICAL) ×2
ELECTRODE REM PT RTRN 9FT ADLT (ELECTROSURGICAL) ×1 IMPLANT
GAUZE PACKING FOLDED 2  STR (GAUZE/BANDAGES/DRESSINGS)
GAUZE PACKING FOLDED 2 STR (GAUZE/BANDAGES/DRESSINGS) IMPLANT
GAUZE SPONGE 2X2 8PLY STRL LF (GAUZE/BANDAGES/DRESSINGS) IMPLANT
GAUZE SPONGE 4X4 16PLY XRAY LF (GAUZE/BANDAGES/DRESSINGS) ×2 IMPLANT
GLOVE BIO SURGEON STRL SZ 6.5 (GLOVE) ×2 IMPLANT
GLOVE BIO SURGEON STRL SZ7.5 (GLOVE) ×2 IMPLANT
GOWN STRL NON-REIN LRG LVL3 (GOWN DISPOSABLE) ×4 IMPLANT
KIT BASIN OR (CUSTOM PROCEDURE TRAY) ×2 IMPLANT
KIT ROOM TURNOVER OR (KITS) ×2 IMPLANT
NEEDLE 27GAX1X1/2 (NEEDLE) IMPLANT
NEEDLE FILTER BLUNT 18X 1/2SAF (NEEDLE)
NEEDLE FILTER BLUNT 18X1 1/2 (NEEDLE) IMPLANT
PAD ARMBOARD 7.5X6 YLW CONV (MISCELLANEOUS) ×2 IMPLANT
PENCIL BUTTON HOLSTER BLD 10FT (ELECTRODE) ×2 IMPLANT
SPONGE GAUZE 2X2 STER 10/PKG (GAUZE/BANDAGES/DRESSINGS)
SPONGE GAUZE 4X4 12PLY (GAUZE/BANDAGES/DRESSINGS) IMPLANT
SPONGE SURGIFOAM ABS GEL 12-7 (HEMOSTASIS) IMPLANT
SPONGE SURGIFOAM ABS GEL SZ50 (HEMOSTASIS) IMPLANT
SUT CHROMIC 3 0 PS 2 (SUTURE) ×2 IMPLANT
SYR CONTROL 10ML LL (SYRINGE) IMPLANT
TOOTHBRUSH ADULT (PERSONAL CARE ITEMS) IMPLANT
TOWEL OR 17X24 6PK STRL BLUE (TOWEL DISPOSABLE) IMPLANT
TOWEL OR 17X26 10 PK STRL BLUE (TOWEL DISPOSABLE) ×2 IMPLANT
TUBE CONNECTING 12X1/4 (SUCTIONS) ×2 IMPLANT
WATER STERILE IRR 1000ML POUR (IV SOLUTION) ×4 IMPLANT
YANKAUER SUCT BULB TIP NO VENT (SUCTIONS) ×2 IMPLANT

## 2012-01-25 NOTE — Brief Op Note (Signed)
01/25/2012  5:20 PM  PATIENT:  Margaret Cooper  26 y.o. female  PRE-OPERATIVE DIAGNOSIS:  DENTAL CARIES  POST-OPERATIVE DIAGNOSIS:   PROCEDURE:  Procedure(s) (LRB): DENTAL RESTORATION/EXTRACTIONS (Bilateral)  SURGEON:  Surgeon(s) and Role:    * Esaw Dace., DDS - Primary  PHYSICIAN ASSISTANT:   ASSISTANTS:Margaret Cooper  ANESTHESIA:   general  EBL:  Total I/O In: 1500 [I.V.:1500] Out: -   BLOOD ADMINISTERED:none  DRAINS: none   LOCAL MEDICATIONS USED:  NONE  SPECIMEN:  No Specimen  DISPOSITION OF SPECIMEN:  N/A  COUNTS:  YES  TOURNIQUET:  * No tourniquets in log *  DICTATION: .Phone   PLAN OF CARE: Discharge to home after PACU  PATIENT DISPOSITION:  PACU - hemodynamically stable.   Delay start of Pharmacological VTE agent (>24hrs) due to surgical blood loss or risk of bleeding: not applicable

## 2012-01-25 NOTE — Anesthesia Preprocedure Evaluation (Addendum)
Anesthesia Evaluation  Patient identified by MRN, date of birth, ID band Patient awake and Patient confused    Airway Mallampati: II TM Distance: >3 FB Neck ROM: Full    Dental  (+) Teeth Intact, Poor Dentition and Dental Advisory Given   Pulmonary  breath sounds clear to auscultation        Cardiovascular Rhythm:Regular Rate:Normal     Neuro/Psych PSYCHIATRIC DISORDERS Down Syndrome    GI/Hepatic   Endo/Other    Renal/GU      Musculoskeletal   Abdominal   Peds  Hematology   Anesthesia Other Findings   Reproductive/Obstetrics                           Anesthesia Physical Anesthesia Plan  ASA: III  Anesthesia Plan: General   Post-op Pain Management:    Induction: Intravenous  Airway Management Planned: Oral ETT  Additional Equipment:   Intra-op Plan:   Post-operative Plan: Extubation in OR  Informed Consent: I have reviewed the patients History and Physical, chart, labs and discussed the procedure including the risks, benefits and alternatives for the proposed anesthesia with the patient or authorized representative who has indicated his/her understanding and acceptance.   Dental advisory given  Plan Discussed with: CRNA, Anesthesiologist and Surgeon  Anesthesia Plan Comments:         Anesthesia Quick Evaluation

## 2012-01-25 NOTE — Discharge Instructions (Signed)
Discharge when stable.

## 2012-01-25 NOTE — Anesthesia Postprocedure Evaluation (Signed)
  Anesthesia Post-op Note  Patient: Margaret Cooper  Procedure(s) Performed: Procedure(s) (LRB): DENTAL RESTORATION/EXTRACTIONS (Bilateral)  Patient Location: PACU  Anesthesia Type: General  Level of Consciousness: awake, alert  and oriented  Airway and Oxygen Therapy: Patient Spontanous Breathing  Post-op Pain: none  Post-op Assessment: Post-op Vital signs reviewed  Post-op Vital Signs: Reviewed and stable  Complications: No apparent anesthesia complications

## 2012-01-25 NOTE — Transfer of Care (Signed)
Immediate Anesthesia Transfer of Care Note  Patient: Margaret Cooper  Procedure(s) Performed: Procedure(s) (LRB): DENTAL RESTORATION/EXTRACTIONS (Bilateral)  Patient Location: PACU  Anesthesia Type: General  Level of Consciousness: awake, alert , oriented and patient cooperative  Airway & Oxygen Therapy: Patient Spontanous Breathing and Patient connected to nasal cannula oxygen  Post-op Assessment: Report given to PACU RN, Post -op Vital signs reviewed and stable and Patient moving all extremities X 4  Post vital signs: Reviewed and stable  Complications: No apparent anesthesia complications

## 2012-01-25 NOTE — Preoperative (Signed)
Beta Blockers   Reason not to administer Beta Blockers:Not Applicable 

## 2012-01-25 NOTE — H&P (Signed)
Patient approved for surgery per [facility]

## 2012-01-26 NOTE — Op Note (Signed)
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  Dental care was provided in the operating room for medically necessary treatment.  OPERATION:  Full mouth oral rehabilitation including exam, x-rays, cleaning, extraction, and operative.  SURGEON:  Azucena Freed, DDS  ASSISTANT:  Laqueta Carina and hospital staff.  ANESTHESIA:  General.  PROCEDURE:  The patient was brought in to the operating room and placed in the supine position.  General anesthesia was administered via nasal intubation.  The patient was prepped and draped in the usual manner for an intraoral general dentistry procedure.  Oropharynx was suctioned and a moistened posterior throat pack was placed.  A full intraoral exam including all hard and soft tissues was performed.  This was a recall exam.  Soft tissue exam reveals floor of the mouth, buccal mucosa, soft palate, hard palate, tongue, gingiva, and frenum attachments all within normal limits with regard to size, consistency, and color.  Hard tissue exam reveals tooth #1 missing, teeth 2 through 4 present, #5 missing, #6 through 12 present, #13 missing, #14 and 15 present, 16 and 17 missing, #18 through #28 present, #T present, #30 present, #31 and 32 missing.  A full mouth series of digital x-rays was taken and a full mouth debridement was done.  Operative care was provided with a high-speed handpiece #557 and #4 burr and a slow-speed handpiece #4 burr.  Amalgams were completed on #2 OL, 3 DOL, 4 O, 14 O, 15 O, 18 OF, 19 F, 28 F. Composites were completed on  #12 O, #T was extracted.  A 30 mg Toradol was given in the OR for postop pain.  The mouth was suctioned dry and a posterior throat pack was carefully removed with constant suction.  The patient was awakened in the OR and transferred to the recovery room in good condition.     Esaw Dace., D.D.S.     WEM/MEDQ  D:  01/25/2012  T:  01/25/2012  Job:  [id number]

## 2012-01-28 ENCOUNTER — Encounter (HOSPITAL_COMMUNITY): Payer: Self-pay | Admitting: Dentistry

## 2012-04-21 ENCOUNTER — Other Ambulatory Visit: Payer: Self-pay | Admitting: *Deleted

## 2012-04-21 NOTE — Telephone Encounter (Signed)
Received a fax requesting refill for Margaret Cooper , needed for July

## 2012-04-22 ENCOUNTER — Other Ambulatory Visit: Payer: Self-pay | Admitting: *Deleted

## 2012-04-22 NOTE — Telephone Encounter (Signed)
Female caller left message stating that she is calling about a refill for Margaret Cooper. She further states that the pharmacy has left several messages for [facility]. She would like a call back.

## 2012-04-23 MED ORDER — LEVONORGEST-ETH ESTRAD 91-DAY 0.15-0.03 MG PO TABS: 1.0000 | ORAL_TABLET | Freq: Every day | ORAL | Status: DC

## 2012-04-23 NOTE — Telephone Encounter (Signed)
Returned call and was told that Margaret Cooper needs a refill of her birth control pills.  I stated that I will send in the refill - pharmacy verified as [facility]- already on file.  She will need her next Gyn exam in Jan. 2016 per Dr. Macon Large.

## 2012-05-07 ENCOUNTER — Other Ambulatory Visit: Payer: Self-pay | Admitting: Family

## 2012-05-07 MED ORDER — LEVONORGEST-ETH ESTRAD 91-DAY 0.15-0.03 MG PO TABS: 1.0000 | ORAL_TABLET | Freq: Every day | ORAL | Status: DC

## 2012-10-15 ENCOUNTER — Other Ambulatory Visit: Payer: Self-pay

## 2012-10-15 ENCOUNTER — Emergency Department (HOSPITAL_COMMUNITY): Payer: Medicaid Other

## 2012-10-15 ENCOUNTER — Inpatient Hospital Stay (HOSPITAL_COMMUNITY)
Admission: EM | Admit: 2012-10-15 | Discharge: 2012-10-25 | DRG: 207 | Disposition: A | Discharge status: Home / Self Care | Payer: Medicaid Other | Attending: Internal Medicine | Admitting: Internal Medicine

## 2012-10-15 ENCOUNTER — Inpatient Hospital Stay (HOSPITAL_COMMUNITY): Payer: Medicaid Other

## 2012-10-15 ENCOUNTER — Encounter (HOSPITAL_COMMUNITY): Payer: Self-pay

## 2012-10-15 DIAGNOSIS — J9601 Acute respiratory failure with hypoxia: Secondary | ICD-10-CM

## 2012-10-15 DIAGNOSIS — D696 Thrombocytopenia, unspecified: Secondary | ICD-10-CM | POA: Diagnosis present

## 2012-10-15 DIAGNOSIS — J9600 Acute respiratory failure, unspecified whether with hypoxia or hypercapnia: Secondary | ICD-10-CM

## 2012-10-15 DIAGNOSIS — J80 Acute respiratory distress syndrome: Secondary | ICD-10-CM

## 2012-10-15 DIAGNOSIS — G9340 Encephalopathy, unspecified: Secondary | ICD-10-CM | POA: Diagnosis present

## 2012-10-15 DIAGNOSIS — Z6828 Body mass index (BMI) 28.0-28.9, adult: Secondary | ICD-10-CM

## 2012-10-15 DIAGNOSIS — E669 Obesity, unspecified: Secondary | ICD-10-CM | POA: Diagnosis present

## 2012-10-15 DIAGNOSIS — R7309 Other abnormal glucose: Secondary | ICD-10-CM | POA: Diagnosis present

## 2012-10-15 DIAGNOSIS — J09X1 Influenza due to identified novel influenza A virus with pneumonia: Principal | ICD-10-CM | POA: Diagnosis present

## 2012-10-15 DIAGNOSIS — R579 Shock, unspecified: Secondary | ICD-10-CM

## 2012-10-15 DIAGNOSIS — R6521 Severe sepsis with septic shock: Secondary | ICD-10-CM

## 2012-10-15 DIAGNOSIS — Z79899 Other long term (current) drug therapy: Secondary | ICD-10-CM

## 2012-10-15 DIAGNOSIS — J9589 Other postprocedural complications and disorders of respiratory system, not elsewhere classified: Secondary | ICD-10-CM

## 2012-10-15 DIAGNOSIS — R6520 Severe sepsis without septic shock: Secondary | ICD-10-CM | POA: Diagnosis present

## 2012-10-15 DIAGNOSIS — R0902 Hypoxemia: Secondary | ICD-10-CM | POA: Diagnosis present

## 2012-10-15 DIAGNOSIS — Q909 Down syndrome, unspecified: Secondary | ICD-10-CM

## 2012-10-15 DIAGNOSIS — R042 Hemoptysis: Secondary | ICD-10-CM | POA: Diagnosis present

## 2012-10-15 DIAGNOSIS — A419 Sepsis, unspecified organism: Secondary | ICD-10-CM

## 2012-10-15 DIAGNOSIS — E870 Hyperosmolality and hypernatremia: Secondary | ICD-10-CM | POA: Diagnosis present

## 2012-10-15 DIAGNOSIS — E872 Acidosis, unspecified: Secondary | ICD-10-CM | POA: Diagnosis present

## 2012-10-15 DIAGNOSIS — R1310 Dysphagia, unspecified: Secondary | ICD-10-CM | POA: Diagnosis present

## 2012-10-15 DIAGNOSIS — R0489 Hemorrhage from other sites in respiratory passages: Secondary | ICD-10-CM

## 2012-10-15 DIAGNOSIS — J101 Influenza due to other identified influenza virus with other respiratory manifestations: Secondary | ICD-10-CM | POA: Diagnosis present

## 2012-10-15 DIAGNOSIS — J189 Pneumonia, unspecified organism: Secondary | ICD-10-CM

## 2012-10-15 DIAGNOSIS — D638 Anemia in other chronic diseases classified elsewhere: Secondary | ICD-10-CM | POA: Diagnosis present

## 2012-10-15 HISTORY — DX: Unspecified convulsions: R56.9

## 2012-10-15 LAB — CARBOXYHEMOGLOBIN
Carboxyhemoglobin: 0.6 % (ref 0.5–1.5)
Carboxyhemoglobin: 0.6 % (ref 0.5–1.5)
Methemoglobin: 0.7 % (ref 0.0–1.5)
O2 Saturation: 64.2 %
O2 Saturation: 67.9 %
Total hemoglobin: 10.2 g/dL — ABNORMAL LOW (ref 12.0–16.0)
Total hemoglobin: 9.9 g/dL — ABNORMAL LOW (ref 12.0–16.0)

## 2012-10-15 LAB — POCT I-STAT, CHEM 8
BUN: 45 mg/dL — ABNORMAL HIGH (ref 6–23)
Chloride: 104 mEq/L (ref 96–112)
Creatinine, Ser: 3.4 mg/dL — ABNORMAL HIGH (ref 0.50–1.10)
Glucose, Bld: 99 mg/dL (ref 70–99)
Potassium: 4.5 mEq/L (ref 3.5–5.1)
Sodium: 143 mEq/L (ref 135–145)

## 2012-10-15 LAB — BLOOD GAS, ARTERIAL
Bicarbonate: 20.6 mEq/L (ref 20.0–24.0)
MECHVT: 450 mL
PEEP: 10 cmH2O
Patient temperature: 98.6
pCO2 arterial: 36.5 mmHg (ref 35.0–45.0)
pH, Arterial: 7.37 (ref 7.350–7.450)

## 2012-10-15 LAB — CBC
HCT: 29.4 % — ABNORMAL LOW (ref 36.0–46.0)
HCT: 30.3 % — ABNORMAL LOW (ref 36.0–46.0)
Hemoglobin: 9.8 g/dL — ABNORMAL LOW (ref 12.0–15.0)
MCH: 30.6 pg (ref 26.0–34.0)
MCV: 94.7 fL (ref 78.0–100.0)
Platelets: 41 10*3/uL — ABNORMAL LOW (ref 150–400)
RBC: 3.2 MIL/uL — ABNORMAL LOW (ref 3.87–5.11)
RDW: 15.8 % — ABNORMAL HIGH (ref 11.5–15.5)
WBC: 3 10*3/uL — ABNORMAL LOW (ref 4.0–10.5)

## 2012-10-15 LAB — CBC WITH DIFFERENTIAL/PLATELET
Basophils Absolute: 0 10*3/uL (ref 0.0–0.1)
Eosinophils Absolute: 0 10*3/uL (ref 0.0–0.7)
Lymphs Abs: 1 10*3/uL (ref 0.7–4.0)
MCH: 29.8 pg (ref 26.0–34.0)
MCHC: 31.4 g/dL (ref 30.0–36.0)
MCV: 95.2 fL (ref 78.0–100.0)
Monocytes Absolute: 0.3 10*3/uL (ref 0.1–1.0)
Platelets: 54 10*3/uL — ABNORMAL LOW (ref 150–400)
RDW: 15.9 % — ABNORMAL HIGH (ref 11.5–15.5)
WBC: 4.6 10*3/uL (ref 4.0–10.5)

## 2012-10-15 LAB — COMPREHENSIVE METABOLIC PANEL
AST: 132 U/L — ABNORMAL HIGH (ref 0–37)
Albumin: 2.2 g/dL — ABNORMAL LOW (ref 3.5–5.2)
BUN: 43 mg/dL — ABNORMAL HIGH (ref 6–23)
Calcium: 7.8 mg/dL — ABNORMAL LOW (ref 8.4–10.5)
Creatinine, Ser: 3.83 mg/dL — ABNORMAL HIGH (ref 0.50–1.10)

## 2012-10-15 LAB — BASIC METABOLIC PANEL
BUN: 38 mg/dL — ABNORMAL HIGH (ref 6–23)
BUN: 34 mg/dL — ABNORMAL HIGH (ref 6–23)
CO2: 20 mEq/L (ref 19–32)
Calcium: 6.3 mg/dL — CL (ref 8.4–10.5)
Calcium: 6.5 mg/dL — ABNORMAL LOW (ref 8.4–10.5)
Chloride: 108 mEq/L (ref 96–112)
Creatinine, Ser: 2.45 mg/dL — ABNORMAL HIGH (ref 0.50–1.10)
GFR calc non Af Amer: 20 mL/min — ABNORMAL LOW (ref >90)
Glucose, Bld: 107 mg/dL — ABNORMAL HIGH (ref 70–99)
Glucose, Bld: 107 mg/dL — ABNORMAL HIGH (ref 70–99)

## 2012-10-15 LAB — BLOOD GAS, VENOUS
Acid-base deficit: 1.1 mmol/L (ref 0.0–2.0)
Bicarbonate: 27 mEq/L — ABNORMAL HIGH (ref 20.0–24.0)
O2 Saturation: 41.7 %
TCO2: 25.9 mmol/L (ref 0–100)
pCO2, Ven: 65.5 mmHg — ABNORMAL HIGH (ref 45.0–50.0)
pO2, Ven: 29.8 mmHg — CL (ref 30.0–45.0)

## 2012-10-15 LAB — PROTIME-INR
INR: 0.98 (ref 0.00–1.49)
Prothrombin Time: 12.9 seconds (ref 11.6–15.2)

## 2012-10-15 LAB — URINE MICROSCOPIC-ADD ON

## 2012-10-15 LAB — VALPROIC ACID LEVEL: Valproic Acid Lvl: 46.6 ug/mL — ABNORMAL LOW (ref 50.0–100.0)

## 2012-10-15 LAB — URINALYSIS, ROUTINE W REFLEX MICROSCOPIC
Glucose, UA: NEGATIVE mg/dL
Leukocytes, UA: NEGATIVE
Specific Gravity, Urine: 1.03 (ref 1.005–1.030)
Urobilinogen, UA: 1 mg/dL (ref 0.0–1.0)

## 2012-10-15 LAB — PROCALCITONIN: Procalcitonin: 1.58 ng/mL

## 2012-10-15 LAB — GLUCOSE, CAPILLARY: Glucose-Capillary: 99 mg/dL (ref 70–99)

## 2012-10-15 LAB — PHOSPHORUS: Phosphorus: 6.5 mg/dL — ABNORMAL HIGH (ref 2.3–4.6)

## 2012-10-15 LAB — STREP PNEUMONIAE URINARY ANTIGEN: Strep Pneumo Urinary Antigen: NEGATIVE

## 2012-10-15 LAB — MAGNESIUM: Magnesium: 2.7 mg/dL — ABNORMAL HIGH (ref 1.5–2.5)

## 2012-10-15 MED ORDER — SODIUM CHLORIDE 0.9 % IV SOLN: INTRAVENOUS | Status: DC

## 2012-10-15 MED ORDER — NOREPINEPHRINE BITARTRATE 1 MG/ML IJ SOLN
2.0000 ug/min | INTRAVENOUS | Status: DC
  Administered 2012-10-15: 2 ug/min via INTRAVENOUS
  Filled 2012-10-15: qty 4

## 2012-10-15 MED ORDER — VANCOMYCIN HCL IN DEXTROSE 1-5 GM/200ML-% IV SOLN
1000.0000 mg | INTRAVENOUS | Status: DC
  Administered 2012-10-16: 1000 mg via INTRAVENOUS
  Filled 2012-10-15: qty 200

## 2012-10-15 MED ORDER — SODIUM CHLORIDE 0.9 % IV BOLUS (SEPSIS)
25.0000 mL/kg | Freq: Once | INTRAVENOUS | Status: AC
Start: 2012-10-15 — End: 2012-10-15
  Administered 2012-10-15: 2040 mL via INTRAVENOUS

## 2012-10-15 MED ORDER — PHENYLEPHRINE HCL 10 MG/ML IJ SOLN
25.0000 ug/min | INTRAVENOUS | Status: DC
  Administered 2012-10-15: 100 ug/min via INTRAVENOUS
  Filled 2012-10-15 (×2): qty 1

## 2012-10-15 MED ORDER — MIDAZOLAM HCL 2 MG/2ML IJ SOLN: 2.0000 mg | INTRAMUSCULAR | Status: DC | PRN

## 2012-10-15 MED ORDER — PIPERACILLIN-TAZOBACTAM 3.375 G IVPB 30 MIN: 3.3750 g | Freq: Once | INTRAVENOUS | Status: DC

## 2012-10-15 MED ORDER — DEXTROSE 5 % IV SOLN
2.0000 g | INTRAVENOUS | Status: DC
  Administered 2012-10-15: 2 g via INTRAVENOUS
  Filled 2012-10-15: qty 2

## 2012-10-15 MED ORDER — SODIUM CHLORIDE 0.9 % IV SOLN
INTRAVENOUS | Status: DC
  Administered 2012-10-15: 100 mL/h via INTRAVENOUS

## 2012-10-15 MED ORDER — PIPERACILLIN-TAZOBACTAM 3.375 G IVPB
3.3750 g | Freq: Three times a day (TID) | INTRAVENOUS | Status: DC
  Filled 2012-10-15: qty 50

## 2012-10-15 MED ORDER — SODIUM CHLORIDE 0.9 % IV SOLN
25.0000 ug/h | INTRAVENOUS | Status: DC
  Administered 2012-10-15: 100 ug/h via INTRAVENOUS
  Administered 2012-10-16: 150 ug/h via INTRAVENOUS
  Administered 2012-10-18: 50 ug/h via INTRAVENOUS
  Administered 2012-10-19: 150 ug/h via INTRAVENOUS
  Administered 2012-10-20: 50 ug/h via INTRAVENOUS
  Filled 2012-10-15 (×4): qty 50

## 2012-10-15 MED ORDER — LACTATED RINGERS IV BOLUS (SEPSIS)
500.0000 mL | Freq: Once | INTRAVENOUS | Status: AC
  Administered 2012-10-15: 500 mL via INTRAVENOUS

## 2012-10-15 MED ORDER — LEVOFLOXACIN IN D5W 750 MG/150ML IV SOLN
750.0000 mg | INTRAVENOUS | Status: DC
  Administered 2012-10-15 – 2012-10-17 (×2): 750 mg via INTRAVENOUS
  Filled 2012-10-15 (×2): qty 150

## 2012-10-15 MED ORDER — VECURONIUM BROMIDE 10 MG IV SOLR
2.0000 mg | Freq: Once | INTRAVENOUS | Status: AC
  Administered 2012-10-15: 2 mg via INTRAVENOUS

## 2012-10-15 MED ORDER — VANCOMYCIN HCL IN DEXTROSE 1-5 GM/200ML-% IV SOLN
1000.0000 mg | Freq: Once | INTRAVENOUS | Status: AC
  Administered 2012-10-15: 1000 mg via INTRAVENOUS
  Filled 2012-10-15: qty 200

## 2012-10-15 MED ORDER — MIDAZOLAM BOLUS VIA INFUSION
1.0000 mg | INTRAVENOUS | Status: DC | PRN
  Administered 2012-10-15: 2 mg via INTRAVENOUS
  Filled 2012-10-15: qty 2

## 2012-10-15 MED ORDER — FENTANYL BOLUS VIA INFUSION
25.0000 ug | Freq: Four times a day (QID) | INTRAVENOUS | Status: DC | PRN
  Administered 2012-10-15: 100 ug via INTRAVENOUS
  Filled 2012-10-15: qty 100

## 2012-10-15 MED ORDER — DOBUTAMINE IN D5W 4-5 MG/ML-% IV SOLN
2.5000 ug/kg/min | INTRAVENOUS | Status: DC
  Filled 2012-10-15: qty 250

## 2012-10-15 MED ORDER — SODIUM CHLORIDE 0.9 % IV BOLUS (SEPSIS)
1000.0000 mL | Freq: Once | INTRAVENOUS | Status: AC
  Administered 2012-10-15: 1000 mL via INTRAVENOUS

## 2012-10-15 MED ORDER — SODIUM CHLORIDE 0.9 % IV SOLN
1.0000 g | Freq: Once | INTRAVENOUS | Status: AC
  Administered 2012-10-15: 1 g via INTRAVENOUS
  Filled 2012-10-15: qty 10

## 2012-10-15 MED ORDER — PIPERACILLIN-TAZOBACTAM 3.375 G IVPB
3.3750 g | Freq: Once | INTRAVENOUS | Status: AC
  Administered 2012-10-15: 3.375 g via INTRAVENOUS
  Filled 2012-10-15: qty 50

## 2012-10-15 MED ORDER — ETOMIDATE 2 MG/ML IV SOLN
10.0000 mg | Freq: Once | INTRAVENOUS | Status: AC
  Administered 2012-10-15: 10 mg via INTRAVENOUS

## 2012-10-15 MED ORDER — SODIUM CHLORIDE 0.9 % IV SOLN
1.0000 mg/h | INTRAVENOUS | Status: DC
  Administered 2012-10-15: 2 mg/h via INTRAVENOUS
  Administered 2012-10-17: 0.5 mg/h via INTRAVENOUS
  Filled 2012-10-15 (×3): qty 10

## 2012-10-15 MED ORDER — PANTOPRAZOLE SODIUM 40 MG IV SOLR
40.0000 mg | Freq: Every day | INTRAVENOUS | Status: DC
  Administered 2012-10-15 – 2012-10-20 (×6): 40 mg via INTRAVENOUS
  Filled 2012-10-15 (×7): qty 40

## 2012-10-15 MED ORDER — MIDAZOLAM HCL 5 MG/ML IJ SOLN
5.0000 mg | Freq: Once | INTRAMUSCULAR | Status: AC
  Administered 2012-10-15: 5 mg via INTRAVENOUS

## 2012-10-15 MED ORDER — OSELTAMIVIR PHOSPHATE 75 MG PO CAPS
75.0000 mg | ORAL_CAPSULE | Freq: Two times a day (BID) | ORAL | Status: DC
  Administered 2012-10-15 – 2012-10-16 (×2): 75 mg via ORAL
  Filled 2012-10-15 (×3): qty 1

## 2012-10-15 MED ORDER — FENTANYL CITRATE 0.05 MG/ML IJ SOLN
200.0000 ug | Freq: Once | INTRAMUSCULAR | Status: AC
  Administered 2012-10-15: 200 ug via INTRAVENOUS

## 2012-10-15 MED ORDER — CHLORHEXIDINE GLUCONATE 0.12 % MT SOLN
15.0000 mL | Freq: Two times a day (BID) | OROMUCOSAL | Status: DC
  Administered 2012-10-15 – 2012-10-25 (×15): 15 mL via OROMUCOSAL
  Filled 2012-10-15 (×18): qty 15

## 2012-10-15 MED ORDER — HYDROCORTISONE SOD SUCCINATE 100 MG IJ SOLR
50.0000 mg | Freq: Four times a day (QID) | INTRAMUSCULAR | Status: DC
  Administered 2012-10-15 – 2012-10-16 (×4): 50 mg via INTRAVENOUS
  Filled 2012-10-15 (×2): qty 1
  Filled 2012-10-15: qty 2
  Filled 2012-10-15 (×4): qty 1

## 2012-10-15 MED ORDER — FENTANYL CITRATE 0.05 MG/ML IJ SOLN: 50.0000 ug | INTRAMUSCULAR | Status: DC | PRN

## 2012-10-15 MED ORDER — BIOTENE DRY MOUTH MT LIQD
15.0000 mL | Freq: Four times a day (QID) | OROMUCOSAL | Status: DC
  Administered 2012-10-16 – 2012-10-20 (×20): 15 mL via OROMUCOSAL

## 2012-10-15 NOTE — Procedures (Signed)
Bronchoscopy Procedure Note Margaret Cooper [id number] 12-14-1985  Procedure: Bronchoscopy Indications: Diagnostic evaluation of the airways and Obtain specimens for culture and/or other diagnostic studies  Procedure Details Consent: Unable to obtain consent because of emergent medical necessity. Time Out: Verified patient identification, verified procedure, site/side was marked, verified correct patient position, special equipment/implants available, medications/allergies/relevent history reviewed, required imaging and test results available.  Performed  In preparation for procedure, patient was given 100% FiO2 and bronchoscope lubricated. Sedation: Etomidate  Airway entered and the following bronchi were examined: RUL, RML, RLL, LUL, LLL and Bronchi.   Procedures performed: Brushings performed Bronchoscope removed.  , Patient placed back on 100% FiO2 at conclusion of procedure.    Evaluation Hemodynamic Status: BP stable throughout; O2 sats: stable throughout Patient's Current Condition: stable Specimens:  Sent serosanguinous fluid, bloody Complications: No apparent complications Patient did tolerate procedure well.   Nelda Bucks. 10/15/2012  1. DAH, blood all lobes 2. Poor images 3. BAL RML 4. ETT adjusted to 3 cm above carina  [name]. Tyson Alias, MD, FACP Pgr: [phone] [place]

## 2012-10-15 NOTE — Progress Notes (Signed)
CRITICAL VALUE ALERT  Critical value received:  calcium  Date of notification:  10/15/12  Time of notification:  1647  Critical value read back:yes  Nurse who received alert:  Sherlene Shams, Rn  MD notified (1st page):  Vassie Loll  Time of first page:  1649  MD notified (2nd page):  Time of second page:  Responding MD:  Vassie Loll  Time MD responded:  1650, replacement ordered

## 2012-10-15 NOTE — ED Provider Notes (Signed)
History     CSN: [id number]  Arrival date & time 10/15/12  1000   First MD Initiated Contact with Patient 10/15/12 1037      No chief complaint on file.   (Consider location/radiation/quality/duration/timing/severity/associated sxs/prior treatment) Patient is a 26 y.o. female presenting with weakness. The history is provided by a caregiver.  Weakness The primary symptoms include altered mental status, nausea and vomiting. Primary symptoms do not include fever. The symptoms began 3 to 5 days ago.  Additional symptoms include weakness. 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 She was seen by Dr. Concepcion Elk 3 days ago and given Z-Pack. Since that time she has had vomiting, diarrhea, anorexia, persistent cough, progressive weakness and lethargy. Today she was confused with altered mental status. .    No past medical history on file.  No past surgical history on file.  No family history on file.  History  Substance Use Topics  . Smoking status: Not on file  . Smokeless tobacco: Not on file  . Alcohol Use: Not on file    OB History    No data available      Review of Systems  Constitutional: Positive for activity change, appetite change and fatigue. Negative for fever and chills.  HENT: Negative.  Negative for congestion.   Eyes: Negative.  Negative for discharge.  Respiratory: Positive for cough.   Cardiovascular: Negative.  Negative for leg swelling.  Gastrointestinal: Positive for nausea, vomiting and diarrhea.  Genitourinary: Positive for decreased urine volume. Negative for dysuria.  Musculoskeletal: Negative.  Negative for myalgias and arthralgias.  Skin: Negative.  Negative for rash.  Neurological: Positive for weakness.  Psychiatric/Behavioral: Positive for confusion and altered mental status.    Allergies  Review of patient's allergies  indicates no known allergies.  Home Medications   Current Outpatient Rx  Name  Route  Sig  Dispense  Refill  . AMPHETAMINE-DEXTROAMPHET ER 15 MG PO CP24   Oral   Take 15 mg by mouth every morning.         . ATOMOXETINE HCL 40 MG PO CAPS   Oral   Take 40 mg by mouth 2 (two) times daily.         . AZITHROMYCIN 250 MG PO TABS   Oral   Take 250 mg by mouth daily.         Marland Kitchen MUCINEX DM 30-600 MG PO TB12   Oral   Take 1 tablet by mouth 2 (two) times daily.         . IBUPROFEN 200 MG PO TABS   Oral   Take 200 mg by mouth every 6 (six) hours as needed. pain         . LORAZEPAM 1 MG PO TABS   Oral   Take 1 mg by mouth 3 (three) times daily.         . QUETIAPINE FUMARATE ER 400 MG PO TB24   Oral   Take 400 mg by mouth at bedtime.         Marland Kitchen VALPROIC ACID 250 MG/5ML PO SYRP   Oral   Take 1,000-1,500 mg by mouth 2 (two) times daily. Pt takes 1000 mg ( 20 ml)  in the morning and 1500 mg at night ( 30 ml )           BP 94/50  Pulse 111  Temp  98.3 F (36.8 C) (Oral)  Resp 25  SpO2 95%  Physical Exam  Constitutional: She appears well-developed and well-nourished.  HENT:  Head: Normocephalic.  Mouth/Throat: Mucous membranes are dry.  Eyes: Conjunctivae normal are normal. Pupils are equal, round, and reactive to light.  Neck: Normal range of motion. Neck supple.  Cardiovascular: Regular rhythm.  Tachycardia present.   No murmur heard. Pulmonary/Chest: Effort normal. She has rales.  Abdominal: Soft. She exhibits no mass. There is no rebound and no guarding.  Musculoskeletal: She exhibits no edema.  Neurological:       Patient responds to name, does not follow command, is nonverbal.   Skin: Skin is warm and dry. No rash noted.    ED Course  Procedures (including critical care time)  Labs Reviewed  GLUCOSE, CAPILLARY - Abnormal; Notable for the following:    Glucose-Capillary 114 (*)     All other components within normal limits  CBC WITH DIFFERENTIAL   COMPREHENSIVE METABOLIC PANEL  URINALYSIS, ROUTINE W REFLEX MICROSCOPIC  URINE CULTURE  CULTURE, BLOOD (ROUTINE X 2)  CULTURE, BLOOD (ROUTINE X 2)  LACTIC ACID, PLASMA  BLOOD GAS, VENOUS   No results found.   No diagnosis found.    MDM  Code Sepsis ordered. Discussed patient's presentation with Dr. Darrol Angel with lab studies pending- will call him back with patient's progress.        Rodena Medin, PA-C 10/15/12 1211

## 2012-10-15 NOTE — Progress Notes (Signed)
Chaplain provided support with pt's father and mother (Lindin? and Rene Kocher) as pt being transferred to ICU.   Pt resident of group home.  Family heard of emergency and took bus to hospital.   As family had been at hospital all day with no transportation, Chaplain provided meal pass for family.

## 2012-10-15 NOTE — ED Notes (Signed)
Pt is from group home, caregiver reports pt has been very lethargic since yesterday, fell yesterday. Has had "cold sx" x1 wk, seen at PCP last week, given zpak, ibuprofen. Pt's O2 sat found to be 60% in triage, placed on non rebreather. Pt unable to verbalize pain. Hypotensive in triage.

## 2012-10-15 NOTE — Progress Notes (Addendum)
ANTIBIOTIC CONSULT NOTE - INITIAL  Pharmacy Consult for Vanc/Zosyn Indication: Sepsis, Pneumonia  No Known Allergies  Patient Measurements: Weight: 180 lb (81.647 kg) (estimated weight given 10/15/12)  Vital Signs: Temp: 99.1 F (37.3 C) (12/11 1143) Temp src: Core (Comment) (12/11 1143) BP: 97/51 mmHg (12/11 1245) Pulse Rate: 111  (12/11 1245) Intake/Output from previous day:   Intake/Output from this shift:    Labs:  Basename 10/15/12 1143 10/15/12 1030  WBC -- 4.6  HGB 12.6 11.7*  PLT -- 54*  LABCREA -- --  CREATININE 3.40* 3.83*   CrCl is unknown because there is no height on file for the current visit.  CrCl ~28 ml/min/1.35m2 (normalized)  Microbiology: 12/11 blood x2: 12/11 urine: 12/11 trach aspirate:  Medical History: Past Medical History  Diagnosis Date  . Seizures   . Down's syndrome     PTA Medications:  Adderall XR Strattera Mucinex DM Ativan Seroquel XR Depakene  Assessment: 26 YOF with Down's and mild MR presents with AMS, NV. Seen by PCP 3 days ago, started on Zpack for URI. Now hypotensive requiring pressors. Admit to ICU for r/o sepsis, possible PNA.  SCr elevated 3.4, CrCl ~28 ml/min  WBC normal  Tmax 99.1  Blood, urine, sputum cultures pending  Goal of Therapy:  Vancomycin trough level 15-20 mcg/ml  Plan:   Received vancomycin 1gm IV in ER at 11:36, continue with 1gm IV q24h  F/U renal function and adjust as needed Zosyn 3.375gm IV q8h (4hr extended infusions) Follow up renal function & cultures  Loralee Pacas, PharmD, BCPS Pager: [phone] 10/15/2012,1:07 PM  Addendum: Per CCM, adjust antibiotics to Vancomycin, Ceftazidime, Levaquin  Continue vancomycin as above  Ceftazidime 2gm IV q24h  Levaquin 750mg  IV q48h  Loralee Pacas, PharmD, BCPS 10/15/2012 1:57 PM

## 2012-10-15 NOTE — Progress Notes (Signed)
eLink Physician-Brief Progress Note Patient Name: Margaret Cooper DOB: 1985-11-29 MRN: [id number]  Date of Service  10/15/2012   HPI/Events of Note     eICU Interventions  Hypocalcemia - repleted ANA re -ordered for blood   Intervention Category Intermediate Interventions: Electrolyte abnormality - evaluation and management  Coley Littles V. 10/15/2012, 5:04 PM

## 2012-10-15 NOTE — Procedures (Signed)
Central Venous Catheter Insertion Procedure Note Margaret Cooper [id number] Dec 13, 1985  Procedure: Insertion of Central Venous Catheter Indications: Assessment of intravascular volume, Drug and/or fluid administration and Frequent blood sampling  Procedure Details Consent: Risks of procedure as well as the alternatives and risks of each were explained to the (patient/caregiver).  Consent for procedure obtained. Time Out: Verified patient identification, verified procedure, site/side was marked, verified correct patient position, special equipment/implants available, medications/allergies/relevent history reviewed, required imaging and test results available.  Performed  Maximum sterile technique was used including antiseptics, cap, gloves, gown, hand hygiene, mask and sheet. Skin prep: Chlorhexidine; local anesthetic administered A antimicrobial bonded/coated triple lumen catheter was placed in the left internal jugular vein using the Seldinger technique. Ultrasound guidance used.yes Catheter placed to 20 cm. Blood aspirated via all 3 ports and then flushed x 3. Line sutured x 2 and dressing applied.  Evaluation Blood flow good Complications: No apparent complications Patient did tolerate procedure well. Chest X-ray ordered to verify placement.  CXR: pending.  Brett Canales Minor ACNP Adolph Pollack PCCM Pager [phone] till 3 pm If no answer page [phone] 10/15/2012, 2:02 PM  Shock, access [place]  [name]. Tyson Alias, MD, FACP Pgr: [phone] [place] [facility]

## 2012-10-15 NOTE — Procedures (Signed)
Arterial Catheter Insertion Procedure Note Jakaila Norment [id number] 25-Jun-1986  Procedure: Insertion of Arterial Catheter  Indications: Blood pressure monitoring and Frequent blood sampling  Procedure Details Consent: Risks of procedure as well as the alternatives and risks of each were explained to the (patient/caregiver).  Consent for procedure obtained. Time Out: Verified patient identification, verified procedure, site/side was marked, verified correct patient position, special equipment/implants available, medications/allergies/relevent history reviewed, required imaging and test results available.  Performed  Maximum sterile technique was used including antiseptics, cap, gloves, gown, hand hygiene, mask and sheet. Skin prep: Chlorhexidine; local anesthetic administered 20 gauge catheter was inserted into right radial artery using the Seldinger technique.  Evaluation Blood flow good; BP tracing good. Complications: No apparent complications.  Tolerated well Shock US guidance  [name]. Tyson Alias, MD, FACP Pgr: [phone] [place] [facility]

## 2012-10-15 NOTE — Procedures (Signed)
Intubation Procedure Note Margaret Cooper [id number] October 21, 1986  Procedure: Intubation Indications:   Procedure Details Consent: Risks of procedure as well as the alternatives and risks of each were explained to the (patient/caregiver).  Consent for procedure obtained. Time Out: Verified patient identification, verified procedure, site/side was marked, verified correct patient position, special equipment/implants available, medications/allergies/relevent history reviewed, required imaging and test results available.  Performed Margaret Cooper 4 Used glide to lift and bronch guidance to place ett successful  3 Medications:  Fentanyl 100 mcg Etomidate 10 mg Versed 2 mg NMB    Evaluation Hemodynamic Status: BP stable throughout; O2 sats: transiently fell during during procedure Patient's Current Condition: stable Complications: No apparent complications Patient did tolerate procedure well. Chest X-ray ordered to verify placement.  CXR: pending.   Margaret Cooper Minor ACNP Adolph Pollack PCCM Pager [phone] till 3 pm If no answer page [phone] 10/15/2012, 2:01 PM  Margaret Cooper. Margaret Alias, MD, FACP Pgr: [phone] [facility]

## 2012-10-15 NOTE — H&P (Signed)
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 Presented to [facility] ED 12-11 in acute resp distress, renal failure, shock. PCCM asked to admit.  LINES / TUBES: 12-11 ott>> 12-11 rt rad aline>> 12-11 lt ij  Cvl>>  CULTURES: 12-11 bc x 2>> 12-11 flu panel +h1n1>> 12-11 bal>>  ANTIBIOTICS: 12-11 vanco>> 12-11 zoysn>>12-11 12-11 fortaz>> 12-11 levaquin>> 12-11 tamiflu>>  SIGNIFICANT EVENTS:  12-11 difficut intubation 12/11 bronch DAH  LEVEL OF CARE:  ICU PRIMARY SERVICE:  PCCM CONSULTANTS:   CODE STATUS FULL DIET:  npo DVT Px:  pas GI Px:  ppi  PAST MEDICAL HISTORY :  History reviewed. No pertinent past medical history. History reviewed. No pertinent past surgical history. Prior to Admission medications   Medication Sig Start Date End Date Taking? Authorizing Provider  amphetamine-dextroamphetamine (ADDERALL XR) 15 MG 24 hr capsule Take 15 mg by mouth every morning.   Yes Historical Provider, MD  atomoxetine (STRATTERA) 40 MG capsule Take 40 mg by mouth 2 (two) times daily.   Yes Historical Provider, MD  azithromycin (ZITHROMAX) 250 MG tablet Take 250 mg by mouth daily.   Yes Historical Provider, MD  Dextromethorphan-Guaifenesin (MUCINEX DM) 30-600 MG TB12 Take 1 tablet by mouth 2 (two) times daily.   Yes Historical Provider, MD  ibuprofen (ADVIL,MOTRIN) 200 MG tablet Take 200 mg by mouth every 6 (six) hours as needed. pain   Yes Historical Provider, MD  LORazepam (ATIVAN) 1 MG tablet Take 1 mg by mouth 3 (three) times daily.   Yes Historical Provider, MD  QUEtiapine (SEROQUEL XR) 400 MG 24 hr tablet Take 400 mg by mouth at bedtime.   Yes Historical Provider, MD  Valproic Acid (DEPAKENE) 250 MG/5ML SYRP syrup Take 1,000-1,500 mg by mouth 2 (two) times  daily. Pt takes 1000 mg ( 20 ml)  in the morning and 1500 mg at night ( 30 ml )   Yes Historical Provider, MD   No Known Allergies  FAMILY HISTORY:  History reviewed. No pertinent family history. SOCIAL HISTORY:  reports that she has never smoked. She has never used smokeless tobacco. She reports that she does not drink alcohol or use illicit drugs.  REVIEW OF SYSTEMS: unable to obtain, the dir at home , reported no blood sputum, low grade fever, no one with flu   INTERVAL HISTORY:   VITAL SIGNS: Temp:  [98.3 F (36.8 C)-99.1 F (37.3 C)] 99.1 F (37.3 C) (12/11 1143) Pulse Rate:  [110-111] 111  (12/11 1245) Resp:  [20-25] 22  (12/11 1245) BP: (77-139)/(48-117) 97/51 mmHg (12/11 1245) SpO2:  [59 %-100 %] 98 % (12/11 1245) HEMODYNAMICS:   VENTILATOR SETTINGS:   INTAKE / OUTPUT: Intake/Output    None     PHYSICAL EXAMINATION: General:  Obese downs syndrome in resp distress Neuro:  Anxious and encephalopathic  HEENT:  Severe overbite Cardiovascular:  hsr rrr Lungs:  Coarse rhonchi bilateral Abdomen:  obese Musculoskeletal:  intact Skin:  warm   LABS: Cbc  Lab 10/15/12 1143 10/15/12 1030  WBC -- 4.6  HGB 12.6 11.7*  HCT 37.0 37.3  PLT -- 54*    Chemistry   Lab 10/15/12 1143 10/15/12 1030  NA 143 140  K 4.5 4.8  CL 104 100  CO2 -- 23  BUN 45* 43*  CREATININE 3.40* 3.83*  CALCIUM -- 7.8*  MG -- --  PHOS -- --  GLUCOSE 99 107*    Liver fxn  Lab 10/15/12 1030  AST 132*  ALT 48*  ALKPHOS 39  BILITOT 0.2*  PROT 7.1  ALBUMIN 2.2*   coags No results found for this basename: APTT:3,INR:3 in the last 168 hours Sepsis markers  Lab 10/15/12 1124 10/15/12 1030  LATICACIDVEN 2.5* --  PROCALCITON -- 4.08   Cardiac markers No results found for this basename: CKTOTAL:3,CKMB:3,TROPONINI:3 in the last 168 hours BNP No results found for this basename: PROBNP:3 in the last 168 hours ABG  Lab 10/15/12 1143 10/15/12 1125  PHART -- --  PCO2ART -- --   PO2ART -- --  HCO3 -- 27.0*  TCO2 28 25.9    CBG trend  Lab 10/15/12 1007  GLUCAP 114*    IMAGING: Dg Chest Port 1 View  10/15/2012  *RADIOLOGY REPORT*  Clinical Data: Intubation.  Central line placement  PORTABLE CHEST - 1 VIEW  Comparison: 10/15/2012  Findings: Endotracheal tube in good position.  Left jugular catheter tip in the SVC.  No pneumothorax.  Extensive bilateral airspace disease shows mild improvement from earlier today.  This could be due to pneumonia or heart failure.  IMPRESSION: Endotracheal tube in good position.  Central venous catheter tip in good position.  Diffuse bilateral infiltrate or edema is slightly improved.   Original Report Authenticated By: Carl Best, M.D.    Dg Chest Portable 1 View  10/15/2012  *RADIOLOGY REPORT*  Clinical Data: Cough.  Shortness of breath.  Hypoxemia.  Lethargy. Hypotension.  PORTABLE CHEST - 1 VIEW 10/15/2012 1201 hours:  Comparison: None.  Findings: Cardiac silhouette upper normal in size for the AP portable technique.  Airspace consolidation throughout the mid and lower lungs bilaterally, most confluent at the right base.  IMPRESSION: Bilateral pneumonia.   Original Report Authenticated By: Evangeline Dakin, M.D.     ECG:  DIAGNOSES: Active Problems:  * No active hospital problems. *    ASSESSMENT / PLAN:  PULMONARY  ASSESSMENT: VDRF secondary to ARDS, presumed Pna. Very difficult airway. Hempotysis = DAH R/o pulm renal syndrome R/o FLu, Bacterial PNA, staph? Strep? PLAN:   Emergent intubation, required glide and bronch -ards protocol to plat less 25/30 -wean O2 per protocol -send bal -send auto immune work up compliment, anca, anti gbm etc Increase MV, abg to follow -a line required Repeat pcxr -if major declines will start empric steroids If pulm renal syndrome would need steroids and plasma x change, await data  CARDIOVASCULAR  ASSESSMENT:  Shock(presumed septic with elevated procal) PLAN:  -sepsis  protocol 12-11 1300 - EGDT -STAT line, cvp goal 12 -may need echo with Downs, assess -lactic reviewed -cortisol then stress roids -tsh  RENAL Lab Results  Component Value Date   CREATININE 3.40* 10/15/2012   CREATININE 3.83* 10/15/2012    ASSESSMENT:  ARF, r/o pulm renal syndrome ARF PLAN:   -fluid support -esr, anca, anti gbm, ana, dsdna -monitor creatine -renal US -ro uti  GASTROINTESTINAL  ASSESSMENT:   NPO PLAN:   ppi Early TF lft assessment  HEMATOLOGIC  ASSESSMENT:  R/o pulm / renal syndrime Hempotysis PLAN:  -serial cbc scd Auto immune work up  INFECTIOUS  ASSESSMENT:   Presumed HCAp PNA (from group), empric tx for Flu, r/o superinfection PLAN:   STAT abx Vanc, levo, ceftaz tami Bronch done, send leg fam, BAL  ENDOCRINE  ASSESSMENT:  At risk hyerpglycemia PLAN:   ssi R/o rel Ai, stress roids after cortisol tsh  NEUROLOGIC  ASSESSMENT:\ Encephalopathic, dyschrony PLAN:   -most likely from renal failure, acidosis, sepsis -fent drip Int versed  CLINICAL SUMMARY:  26 yo downs syndrome, tx for pna, flu, renal failure, sepsis, ARDS. R/o pulm / renal  I have personally obtained a history, examined the patient, evaluated laboratory and imaging results, formulated the assessment and plan and placed orders. CRITICAL CARE: The patient is critically ill with multiple organ systems failure and requires high complexity decision making for assessment and support, frequent evaluation and titration of therapies, application of advanced monitoring technologies and extensive interpretation of multiple databases. Critical Care Time devoted to patient care services described in this note is   90 minutes.   Lavon Paganini. Titus Mould, MD, [place] Pgr: [facility]  Pulmonary and [place] Pager: [phone]  10/15/2012, 12:57 PM

## 2012-10-16 ENCOUNTER — Inpatient Hospital Stay (HOSPITAL_COMMUNITY): Payer: Medicaid Other

## 2012-10-16 DIAGNOSIS — A419 Sepsis, unspecified organism: Secondary | ICD-10-CM

## 2012-10-16 DIAGNOSIS — R0489 Hemorrhage from other sites in respiratory passages: Secondary | ICD-10-CM

## 2012-10-16 DIAGNOSIS — I059 Rheumatic mitral valve disease, unspecified: Secondary | ICD-10-CM

## 2012-10-16 DIAGNOSIS — J80 Acute respiratory distress syndrome: Secondary | ICD-10-CM

## 2012-10-16 DIAGNOSIS — J9601 Acute respiratory failure with hypoxia: Secondary | ICD-10-CM

## 2012-10-16 LAB — BLOOD GAS, ARTERIAL
Acid-base deficit: 6 mmol/L — ABNORMAL HIGH (ref 0.0–2.0)
Acid-base deficit: 6.3 mmol/L — ABNORMAL HIGH (ref 0.0–2.0)
Bicarbonate: 21.9 mEq/L (ref 20.0–24.0)
Drawn by: 347861
Drawn by: 129801
FIO2: 0.5 %
FIO2: 0.5 %
MECHVT: 360 mL
MECHVT: 360 mL
O2 Saturation: 97.3 %
PEEP: 10 cmH2O
Patient temperature: 98.6
RATE: 32 resp/min
RATE: 32 resp/min
TCO2: 16.3 mmol/L (ref 0–100)
pCO2 arterial: 29.1 mmHg — ABNORMAL LOW (ref 35.0–45.0)
pH, Arterial: 7.281 — ABNORMAL LOW (ref 7.350–7.450)
pH, Arterial: 7.391 (ref 7.350–7.450)
pO2, Arterial: 88 mmHg (ref 80.0–100.0)
pO2, Arterial: 92.2 mmHg (ref 80.0–100.0)

## 2012-10-16 LAB — RESPIRATORY VIRUS PANEL
Adenovirus: NOT DETECTED
Parainfluenza 2: NOT DETECTED
Parainfluenza 3: NOT DETECTED
Rhinovirus: NOT DETECTED

## 2012-10-16 LAB — MPO/PR-3 (ANCA) ANTIBODIES
Myeloperoxidase Abs: 2 AU/mL (ref <20)
Serine Protease 3: 5 AU/mL (ref <20)

## 2012-10-16 LAB — GLUCOSE, CAPILLARY: Glucose-Capillary: 168 mg/dL — ABNORMAL HIGH (ref 70–99)

## 2012-10-16 LAB — LEGIONELLA ANTIGEN, URINE: Legionella Antigen, Urine: NEGATIVE

## 2012-10-16 LAB — CBC
Hemoglobin: 9.5 g/dL — ABNORMAL LOW (ref 12.0–15.0)
MCH: 30.8 pg (ref 26.0–34.0)
MCHC: 33.2 g/dL (ref 30.0–36.0)
MCV: 92.9 fL (ref 78.0–100.0)

## 2012-10-16 LAB — MAGNESIUM: Magnesium: 2 mg/dL (ref 1.5–2.5)

## 2012-10-16 LAB — ANCA SCREEN W REFLEX TITER
Atypical p-ANCA Screen: NEGATIVE
c-ANCA Screen: NEGATIVE
c-ANCA Screen: NEGATIVE
p-ANCA Screen: NEGATIVE
p-ANCA Screen: NEGATIVE

## 2012-10-16 LAB — ANA: Anti Nuclear Antibody(ANA): NEGATIVE

## 2012-10-16 LAB — BASIC METABOLIC PANEL
BUN: 32 mg/dL — ABNORMAL HIGH (ref 6–23)
CO2: 18 mEq/L — ABNORMAL LOW (ref 19–32)
Calcium: 6.7 mg/dL — ABNORMAL LOW (ref 8.4–10.5)
Creatinine, Ser: 2.25 mg/dL — ABNORMAL HIGH (ref 0.50–1.10)
GFR calc non Af Amer: 28 mL/min — ABNORMAL LOW (ref >90)
Glucose, Bld: 126 mg/dL — ABNORMAL HIGH (ref 70–99)
Sodium: 141 mEq/L (ref 135–145)

## 2012-10-16 LAB — INFLUENZA PANEL BY PCR (TYPE A & B): Influenza A By PCR: POSITIVE — AB

## 2012-10-16 LAB — APTT: aPTT: 32 seconds (ref 24–37)

## 2012-10-16 MED ORDER — INSULIN ASPART 100 UNIT/ML ~~LOC~~ SOLN
2.0000 [IU] | SUBCUTANEOUS | Status: DC
  Administered 2012-10-16 (×2): 4 [IU] via SUBCUTANEOUS
  Administered 2012-10-17: 6 [IU] via SUBCUTANEOUS
  Administered 2012-10-17 – 2012-10-19 (×11): 2 [IU] via SUBCUTANEOUS
  Administered 2012-10-19 (×2): 4 [IU] via SUBCUTANEOUS
  Administered 2012-10-20: 6 [IU] via SUBCUTANEOUS
  Administered 2012-10-20: 2 [IU] via SUBCUTANEOUS
  Administered 2012-10-20: 4 [IU] via SUBCUTANEOUS
  Administered 2012-10-23: 2 [IU] via SUBCUTANEOUS

## 2012-10-16 MED ORDER — DEXTROSE 5 % IV SOLN
2.0000 g | Freq: Two times a day (BID) | INTRAVENOUS | Status: DC
  Administered 2012-10-16 – 2012-10-17 (×4): 2 g via INTRAVENOUS
  Filled 2012-10-16 (×5): qty 2

## 2012-10-16 MED ORDER — VALPROIC ACID 250 MG/5ML PO SYRP
1500.0000 mg | ORAL_SOLUTION | Freq: Every day | ORAL | Status: DC
  Administered 2012-10-16 – 2012-10-19 (×4): 1500 mg
  Filled 2012-10-16 (×5): qty 30

## 2012-10-16 MED ORDER — VANCOMYCIN HCL 1000 MG IV SOLR
750.0000 mg | Freq: Two times a day (BID) | INTRAVENOUS | Status: DC
  Administered 2012-10-17 – 2012-10-19 (×6): 750 mg via INTRAVENOUS
  Filled 2012-10-16 (×7): qty 750

## 2012-10-16 MED ORDER — QUETIAPINE FUMARATE 200 MG PO TABS
200.0000 mg | ORAL_TABLET | Freq: Two times a day (BID) | ORAL | Status: DC
Start: 2012-10-16 — End: 2012-10-23
  Administered 2012-10-16 – 2012-10-22 (×9): 200 mg
  Filled 2012-10-16 (×16): qty 1

## 2012-10-16 MED ORDER — PRO-STAT SUGAR FREE PO LIQD
30.0000 mL | Freq: Every day | ORAL | Status: DC
  Administered 2012-10-16 – 2012-10-19 (×4): 30 mL
  Filled 2012-10-16 (×7): qty 30

## 2012-10-16 MED ORDER — OXEPA PO LIQD
1000.0000 mL | ORAL | Status: DC
  Administered 2012-10-16 – 2012-10-19 (×4): 1000 mL
  Filled 2012-10-16 (×5): qty 1000

## 2012-10-16 MED ORDER — SODIUM CHLORIDE 0.9 % IV SOLN
INTRAVENOUS | Status: DC
  Administered 2012-10-16: 50 mL/h via INTRAVENOUS

## 2012-10-16 MED ORDER — VALPROIC ACID 250 MG/5ML PO SYRP
1000.0000 mg | ORAL_SOLUTION | Freq: Every day | ORAL | Status: DC
  Administered 2012-10-16 – 2012-10-19 (×4): 1000 mg
  Filled 2012-10-16 (×5): qty 20

## 2012-10-16 MED ORDER — OSELTAMIVIR PHOSPHATE 6 MG/ML PO SUSR
75.0000 mg | Freq: Two times a day (BID) | ORAL | Status: AC
  Administered 2012-10-16 – 2012-10-19 (×7): 75 mg
  Filled 2012-10-16 (×9): qty 12.5

## 2012-10-16 NOTE — Plan of Care (Signed)
Problem: ICU Phase Progression Outcomes Goal: Voiding-avoid urinary catheter unless indicated Outcome: Not Met (add Reason) Sepsis Protocol, Sedated and orally intubated, needing to monitor urine output in the ICU  Problem: Phase I Progression Outcomes Goal: Hemodynamically stable Outcome: Progressing Wean off Levophed at 2300 on 10/15/12 Goal: Progress activity as tolerated unless otherwise ordered Outcome: Not Applicable Date Met:  10/16/12 Bedrest Goal: Tolerating diet NPO

## 2012-10-16 NOTE — Progress Notes (Signed)
eLink Physician-Brief Progress Note Patient Name: Margaret Cooper DOB: [birth year] MRN: [id number]  Date of Service  10/16/2012   HPI/Events of Note     eICU Interventions  ICU HG protocol   Intervention Category Intermediate Interventions: Hyperglycemia - evaluation and treatment  [name] V. 10/16/2012, 4:25 PM

## 2012-10-16 NOTE — Consult Note (Signed)
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 Presented to [facility] ED 12-11 in acute resp distress, renal failure, shock. PCCM asked to admit.  LINES / TUBES: 12-11 ott>> 12-11 rt rad aline>> 12-11 lt ij  Cvl>>  CULTURES: 12-11 bc x 2>> 12-11 flu panel +h1n1>> 12-11 bal>>  ANTIBIOTICS: 12-11 vanco>> 12-11 zoysn>>12-11 12-11 fortaz>> 12-11 levaquin>> 12-11 tamiflu>>  SIGNIFICANT EVENTS:  12-11 difficut intubation 12/11 bronch DAH  LEVEL OF CARE:  ICU PRIMARY SERVICE:  PCCM CONSULTANTS:   CODE STATUS FULL DIET:  npo DVT Px:  pas GI Px:  ppi  VITAL SIGNS: Temp:  [98.3 F (36.8 C)-100.4 F (38 C)] 100.4 F (38 C) (12/12 0700) Pulse Rate:  [104-112] 104  (12/12 0700) Resp:  [17-32] 31  (12/12 0700) BP: (77-139)/(48-117) 101/62 mmHg (12/12 0400) SpO2:  [59 %-100 %] 96 % (12/12 0700) FiO2 (%):  [50 %-100 %] 50 % (12/12 0420) Weight:  [81.647 kg (180 lb)-84.2 kg (185 lb 10 oz)] 84.2 kg (185 lb 10 oz) (12/12 0400) HEMODYNAMICS: CVP:  [11 mmHg-14 mmHg] 14 mmHg VENTILATOR SETTINGS: Vent Mode:  [-] PRVC FiO2 (%):  [50 %-100 %] 50 % Set Rate:  [20 bmp-32 bmp] 32 bmp Vt Set:  [360 mL-450 mL] 360 mL PEEP:  [10 cmH20] 10 cmH20 Pressure Support:  [0 cmH20-5 cmH20] 0 cmH20 Plateau Pressure:  [23 cmH20-28 cmH20] 23 cmH20 INTAKE / OUTPUT: Intake/Output      12/11 0701 - 12/12 0700 12/12 0701 - 12/13 0700   I.V. (mL/kg) 2014.8 (23.9)    IV Piggyback 260    Total Intake(mL/kg) 2274.8 (27)    Urine (mL/kg/hr) 1205 (0.6)    Total Output 1205    Net +1069.8          PHYSICAL EXAMINATION: General:  Obese downs syndrome in resp distress Neuro:  Anxious and encephalopathic  HEENT:  Severe overbite Cardiovascular:  hsr rrr Lungs:  Coarse rhonchi  bilateral Abdomen:  obese Musculoskeletal:  intact Skin:  warm  LABS: Cbc  Lab 10/16/12 0520 10/15/12 2220 10/15/12 1800  WBC 6.0 -- --  HGB 9.5* 9.8* 9.7*  HCT 28.6* 30.3* 29.4*  PLT 44* 42* 41*   Chemistry  Lab 10/16/12 0520 10/15/12 2220 10/15/12 1530 10/15/12 1125  NA 141 139 141 --  K 4.1 4.4 4.0 --  CL 109 108 108 --  CO2 18* 20 24 --  BUN 32* 34* 38* --  CREATININE 2.25* 2.45* 2.99* --  CALCIUM 6.7* 6.5* 6.3* --  MG 2.0 -- -- 2.7*  PHOS 2.8 -- -- 6.5*  GLUCOSE 126* 107* 107* --   Liver fxn  Lab 10/15/12 1030  AST 132*  ALT 48*  ALKPHOS 39  BILITOT 0.2*  PROT 7.1  ALBUMIN 2.2*   coags  Lab 10/16/12 0520 10/15/12 1125  APTT 32 34  INR 1.04 0.98   Sepsis markers  Lab 10/15/12 2220 10/15/12 1124 10/15/12 1030  LATICACIDVEN -- 2.5* --  PROCALCITON 1.58 -- 4.08   Cardiac markers No results found for this basename: CKTOTAL:3,CKMB:3,TROPONINI:3 in the last 168 hours BNP No results found for this basename: PROBNP:3 in the last 168 hours ABG  Lab 10/16/12 0501 10/15/12 1759 10/15/12 1400  PHART 7.384 7.281*  7.370  PCO2ART 30.3* 48.0* 36.5  PO2ART 88.0 104.0* 109.0*  HCO3 17.4* 21.9 20.6  TCO2 16.3 20.9 19.3   CBG trend  Lab 10/15/12 1624 10/15/12 1007  GLUCAP 99 114*   IMAGING: US Renal Port  10/15/2012  *RADIOLOGY REPORT*  Clinical Data: Renal failure.  Downs syndrome.  RENAL/URINARY TRACT ULTRASOUND COMPLETE - PORTABLE  Comparison:  None.  Findings:  Right Kidney:  No hydronephrosis.  Well-preserved cortex.  Normal size and parenchymal echotexture without focal abnormalities. Renal length 9.9 cm.  Left Kidney:  No hydronephrosis.  Well-preserved cortex.  Normal size and parenchymal echotexture without focal abnormalities. Renal length 9.7 cm.  Bladder:  Decompressed by Foley catheter and suboptimally evaluated.  IMPRESSION:  1.  The kidneys are symmetric in size without focal abnormality. 2.  No hydronephrosis.  Bladder decompressed by Foley  catheter.   Original Report Authenticated By: Richardean Sale, M.D.    Dg Chest Port 1 View  10/16/2012  *RADIOLOGY REPORT*  Clinical Data: Endotracheal tube placement  PORTABLE CHEST - 1 VIEW  Comparison: Chest radiograph 10/15/2012 no  Findings: Endotracheal tube and left central venous line are unchanged.  Interval placement NG tube which extends to the stomach.  Stable heart silhouette.  There is dense bibasilar air space disease unchanged from prior.  No pneumothorax.  IMPRESSION:  1.  Bibasilar dense air space disease not changed from prior. 2.  Interval placement NG tube into the stomach.   Original Report Authenticated By: Suzy Bouchard, M.D.    Dg Chest Port 1 View  10/15/2012  *RADIOLOGY REPORT*  Clinical Data: Intubation.  Central line placement  PORTABLE CHEST - 1 VIEW  Comparison: 10/15/2012  Findings: Endotracheal tube in good position.  Left jugular catheter tip in the SVC.  No pneumothorax.  Extensive bilateral airspace disease shows mild improvement from earlier today.  This could be due to pneumonia or heart failure.  IMPRESSION: Endotracheal tube in good position.  Central venous catheter tip in good position.  Diffuse bilateral infiltrate or edema is slightly improved.   Original Report Authenticated By: Carl Best, M.D.    Dg Chest Portable 1 View  10/15/2012  *RADIOLOGY REPORT*  Clinical Data: Cough.  Shortness of breath.  Hypoxemia.  Lethargy. Hypotension.  PORTABLE CHEST - 1 VIEW 10/15/2012 1201 hours:  Comparison: None.  Findings: Cardiac silhouette upper normal in size for the AP portable technique.  Airspace consolidation throughout the mid and lower lungs bilaterally, most confluent at the right base.  IMPRESSION: Bilateral pneumonia.   Original Report Authenticated By: Evangeline Dakin, M.D.    ECG:  DIAGNOSES: Active Problems:  ARDS (adult respiratory distress syndrome)  Septic shock(785.52)  Acute respiratory failure with hypoxia  Pulmonary alveolar  hemorrhage  ASSESSMENT / PLAN:  PULMONARY  ASSESSMENT: VDRF secondary to ARDS, presumed Pna. Very difficult airway. Hempotysis = DAH R/o pulm renal syndrome R/o FLu, Bacterial PNA, staph? Strep? PLAN:   - ARDS protocol to plat less 25/30 - Wean O2 per protocol - F/U BAL. - F/U auto immune work up compliment, anca, anti gbm etc - ABG now. - Move ET tube down 2. - If major declines will start empric steroids but holding for now.  But no stress dose steroids, cortisol level of 97.8.  CARDIOVASCULAR  ASSESSMENT:  Shock(presumed septic with elevated procal) PLAN:  - Sepsis protocol 12-11 1300 - EGDT - 2D echo ordered. - Lactic reviewed - D/C stress dose steroids. - TSH noted.  RENAL Lab Results  Component Value Date  CREATININE 2.25* 10/16/2012   CREATININE 2.45* 10/15/2012   CREATININE 2.99* 10/15/2012    ASSESSMENT:  ARF, r/o pulm renal syndrome ARF PLAN:   - Decrease IVF to 50 ml/hr and start TF. - ESR, ANCA, Anti GBM, ANA, dsDNA all pending. - Monitor creatine. - Renal US negative.  GASTROINTESTINAL  ASSESSMENT:   NPO PLAN:   PPI Nutrition consult for TF.  HEMATOLOGIC  ASSESSMENT:  R/o pulm / renal syndrime Hempotysis PLAN:  - Daily CBC. - SCDs. - Auto immune work up pending.  INFECTIOUS  ASSESSMENT:   Presumed HCAp PNA (from group), empric tx for Flu, r/o superinfection PLAN:   - Vanc, levo, ceftaz - Tamiflu - F/U on culture.  ENDOCRINE  ASSESSMENT:  At risk hyerpglycemia PLAN:   SSI D/C hydrocortisone. TSH  NEUROLOGIC  ASSESSMENT: Encephalopathic, dyschrony, most likely from renal failure, acidosis, sepsis PLAN:   - Restart valproic acid and seroquel.  - Fent drip - Intermittent versed - ?baseline.  CLINICAL SUMMARY:  26 yo downs syndrome, tx for pna, flu, renal failure, sepsis, ARDS. R/o pulm / renal  I have personally obtained a history, examined the patient, evaluated laboratory and imaging results, formulated the  assessment and plan and placed orders.  CRITICAL CARE: The patient is critically ill with multiple organ systems failure and requires high complexity decision making for assessment and support, frequent evaluation and titration of therapies, application of advanced monitoring technologies and extensive interpretation of multiple databases. Critical Care Time devoted to patient care services described in this note is 35 minutes.   Rush Farmer, M.D. [facility] Pulmonary/Critical Care Medicine. Pager: [phone]. After hours pager: [phone].

## 2012-10-16 NOTE — Progress Notes (Signed)
INITIAL NUTRITION ASSESSMENT  DOCUMENTATION CODES Per approved criteria  -Not Applicable    INTERVENTION: Will begin Oxepa at 20 ml/hr per OG tube.  Increase 10 ml every 4 hours to goal of 50 ml per hour.   30 ml prostat once daily  Oxepa at 50 ml/hr plus 30 ml prostat once daily to provide:  1900 kcal, 90 gm protein, 942 ml free water.  NUTRITION DIAGNOSIS: Inadequate oral intake related to inability to eat as evidenced by npo status.   Goal: Tolerate TF to meet >95% estimated needs.  Monitor:  TF tolerance, labs, weights, I/O  Reason for Assessment: Consult for TF initiation and management  26 y.o. female  Admitting Dx: Acute respiratory failure, ARF, sepsis, ARDS, pna  ASSESSMENT: 26 yo female with downs syndrome admitted from a group home.  Receiving mechanical ventilation, OG tube in place. Spoke with patients mother who reported no recent weight change and good intake prior to admit.  Appears well nourished.  Height: Ht Readings from Last 1 Encounters:  10/15/12 5\' 6"  (1.676 m)   Weight: Wt Readings from Last 1 Encounters:  10/16/12 185 lb 10 oz (84.2 kg)   Ideal Body Weight: 59 kg  % Ideal Body Weight:  143  Wt Readings from Last 10 Encounters:  10/16/12 185 lb 10 oz (84.2 kg)   Usual Body Weight: 84.2  % Usual Body Weight: 100  BMI:  Body mass index is 29.96 kg/(m^2).  Estimated Nutritional Needs: Kcal: 1976 Protein: 80-100 gm Fluid: 1.9-2L  Skin: WNL  Diet Order: NPO  EDUCATION NEEDS: -No education needs identified at this time   Intake/Output Summary (Last 24 hours) at 10/16/12 1017 Last data filed at 10/16/12 0900  Gross per 24 hour  Intake 2496.79 ml  Output   1330 ml  Net 1166.79 ml   Last BM: unknown   Labs:   Lab 10/16/12 0520 10/15/12 2220 10/15/12 1530 10/15/12 1125  NA 141 139 141 --  K 4.1 4.4 4.0 --  CL 109 108 108 --  CO2 18* 20 24 --  BUN 32* 34* 38* --  CREATININE 2.25* 2.45* 2.99* --  CALCIUM 6.7* 6.5* 6.3*  --  MG 2.0 -- -- 2.7*  PHOS 2.8 -- -- 6.5*  GLUCOSE 126* 107* 107* --    CBG (last 3)   Basename 10/15/12 1624 10/15/12 1007  GLUCAP 99 114*    Scheduled Meds:   . antiseptic oral rinse  15 mL Mouth Rinse QID  . [COMPLETED] calcium gluconate  1 g Intravenous Once  . cefTAZidime (FORTAZ)  IV  2 g Intravenous Q24H  . chlorhexidine  15 mL Mouth Rinse BID  . [COMPLETED] etomidate  10 mg Intravenous Once  . [COMPLETED] fentaNYL  200 mcg Intravenous Once  . hydrocortisone sodium succinate  50 mg Intravenous Q6H  . [COMPLETED] lactated ringers  500 mL Intravenous Once  . levofloxacin (LEVAQUIN) IV  750 mg Intravenous Q48H  . [COMPLETED] midazolam  5 mg Intravenous Once  . oseltamivir  75 mg Oral BID  . pantoprazole (PROTONIX) IV  40 mg Intravenous QHS  . [COMPLETED] piperacillin-tazobactam (ZOSYN)  IV  3.375 g Intravenous Once  . QUEtiapine  200 mg Per Tube BID  . [COMPLETED] sodium chloride  1,000 mL Intravenous Once  . [COMPLETED] sodium chloride  25 mL/kg Intravenous Once  . Valproic Acid  1,000 mg Per Tube Daily  . Valproic Acid  1,500 mg Per Tube QHS  . [COMPLETED] vancomycin  1,000 mg Intravenous  Once  . vancomycin  1,000 mg Intravenous Q24H  . [COMPLETED] vecuronium  2 mg Intravenous Once  . [DISCONTINUED] piperacillin-tazobactam  3.375 g Intravenous Once  . [DISCONTINUED] piperacillin-tazobactam (ZOSYN)  IV  3.375 g Intravenous Q8H   Continuous Infusions:   . sodium chloride    . fentaNYL infusion INTRAVENOUS 100 mcg/hr (10/16/12 0900)  . midazolam (VERSED) infusion 2 mg/hr (10/16/12 0900)  . norepinephrine (LEVOPHED) Adult infusion 1 mcg/min (10/15/12 2000)  . [DISCONTINUED] sodium chloride    . [DISCONTINUED] sodium chloride 100 mL/hr (10/15/12 1641)  . [DISCONTINUED] DOBUTamine    . [DISCONTINUED] phenylephrine (NEO-SYNEPHRINE) Adult infusion 50 mcg/min (10/15/12 1348)    Past Medical History  Diagnosis Date  . Seizures   . Down's syndrome     History  reviewed. No pertinent past surgical history.    Oran Rein, RD, LDN Clinical Inpatient Dietitian Pager:  [phone] Weekend and after hours pager:  [phone]

## 2012-10-16 NOTE — Progress Notes (Signed)
ETT advanced 2 cm to 25 cm

## 2012-10-16 NOTE — Progress Notes (Signed)
  Echocardiogram 2D Echocardiogram has been performed.  Margaret Cooper 10/16/2012, 10:04 AM

## 2012-10-16 NOTE — Progress Notes (Signed)
12122013/Rhonda Davis, RN, BSN, CCM: CHART REVIEWED AND UPDATED.  Next chart review due on 12152013. NO DISCHARGE NEEDS PRESENT AT THIS TIME. CASE MANAGEMENT [phone] 

## 2012-10-16 NOTE — Progress Notes (Signed)
ANTIBIOTIC CONSULT NOTE  Pharmacy Consult for Vanc/Ceftazidime/Levaquin Indication: Sepsis, Pneumonia  No Known Allergies  Patient Measurements: Height: 5\' 6"  (167.6 cm) Weight: 185 lb 10 oz (84.2 kg) IBW/kg (Calculated) : 59.3   Vital Signs: Temp: 99.9 F (37.7 C) (12/12 1000) Temp src: Core (Comment) (12/12 0800) BP: 107/54 mmHg (12/12 0800) Pulse Rate: 104  (12/12 1000) Intake/Output from previous day: 12/11 0701 - 12/12 0700 In: 2274.8 [I.V.:2014.8; IV Piggyback:260] Out: 1205 [Urine:1205] Intake/Output from this shift: Total I/O In: 294 [I.V.:294] Out: 225 [Urine:225]  Labs:  [facility] 10/16/12 0520 10/15/12 2220 10/15/12 1800 10/15/12 1530  WBC 6.0 4.8 3.0* --  HGB 9.5* 9.8* 9.7* --  PLT 44* 42* 41* --  LABCREA -- -- -- --  CREATININE 2.25* 2.45* -- 2.99*   Estimated Creatinine Clearance: 40.7 ml/min (by C-G formula based on Cr of 2.25).  CrCl ~42 ml/min/1.52m2 (normalized)  Microbiology: 12/11 blood x 2: ngtd 12/11 urine: 12/11 trach asp: 12/11 BAL: 12/11 urine strep/legionella: neg/pending 12/11 influenza A+, B- 12/11 H1N1: DETECTED 12/11 RSV:  Medical History: Past Medical History  Diagnosis Date  . Seizures   . Down's syndrome     Assessment: 39 YOF with Down's and mild MR presents with AMS, NV. Seen by PCP 3 days ago, started on Zpack for URI.  Admitted 12/11 to ICU for r/o sepsis, possible PNA.  SCr elevated but improving  WBC normal  Tmax 100.4  Bacterial cultures pending  Goal of Therapy:  Vancomycin trough level 15-20 mcg/ml Ceftazidime/Levaquin doses per renal function  Plan:   Change vancomycin to 750mg  IV q12h  Check trough at steady state  Change ceftazidime to 2gm IV q12h  Continue levaquin 750mg  IV q48h Follow up renal function & cultures  If renal function continues to improve, consider increasing Tamiflu to 150mg  BID for duration of therapy.  Loralee Pacas, PharmD, BCPS Pager: [phone] 10/16/2012,11:32  AM

## 2012-10-17 ENCOUNTER — Inpatient Hospital Stay (HOSPITAL_COMMUNITY): Payer: Medicaid Other

## 2012-10-17 LAB — BASIC METABOLIC PANEL
BUN: 36 mg/dL — ABNORMAL HIGH (ref 6–23)
Chloride: 113 mEq/L — ABNORMAL HIGH (ref 96–112)
GFR calc Af Amer: 40 mL/min — ABNORMAL LOW (ref >90)
GFR calc non Af Amer: 35 mL/min — ABNORMAL LOW (ref >90)
Potassium: 4.7 mEq/L (ref 3.5–5.1)
Sodium: 143 mEq/L (ref 135–145)

## 2012-10-17 LAB — CBC
HCT: 30.7 % — ABNORMAL LOW (ref 36.0–46.0)
MCHC: 32.6 g/dL (ref 30.0–36.0)
Platelets: 60 10*3/uL — ABNORMAL LOW (ref 150–400)
RDW: 16.3 % — ABNORMAL HIGH (ref 11.5–15.5)
WBC: 7.8 10*3/uL (ref 4.0–10.5)

## 2012-10-17 LAB — GLUCOSE, CAPILLARY
Glucose-Capillary: 124 mg/dL — ABNORMAL HIGH (ref 70–99)
Glucose-Capillary: 136 mg/dL — ABNORMAL HIGH (ref 70–99)

## 2012-10-17 LAB — BLOOD GAS, ARTERIAL
Acid-base deficit: 2.8 mmol/L — ABNORMAL HIGH (ref 0.0–2.0)
Bicarbonate: 21.5 mEq/L (ref 20.0–24.0)
Drawn by: 336861
FIO2: 0.4 %
O2 Saturation: 90 %
RATE: 32 resp/min
pCO2 arterial: 38.1 mmHg (ref 35.0–45.0)
pO2, Arterial: 62 mmHg — ABNORMAL LOW (ref 80.0–100.0)

## 2012-10-17 LAB — PHOSPHORUS: Phosphorus: 2.2 mg/dL — ABNORMAL LOW (ref 2.3–4.6)

## 2012-10-17 LAB — URINE CULTURE: Culture: NO GROWTH

## 2012-10-17 LAB — MAGNESIUM: Magnesium: 2.4 mg/dL (ref 1.5–2.5)

## 2012-10-17 MED ORDER — FUROSEMIDE 10 MG/ML IJ SOLN
20.0000 mg | Freq: Four times a day (QID) | INTRAMUSCULAR | Status: AC
  Administered 2012-10-17 – 2012-10-18 (×3): 20 mg via INTRAVENOUS
  Filled 2012-10-17: qty 2

## 2012-10-17 MED ORDER — SODIUM PHOSPHATE 3 MMOLE/ML IV SOLN
20.0000 mmol | Freq: Once | INTRAVENOUS | Status: AC
  Administered 2012-10-17: 20 mmol via INTRAVENOUS
  Filled 2012-10-17: qty 6.67

## 2012-10-17 NOTE — Progress Notes (Signed)
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 Presented to [facility] ED 12-11 in acute resp distress, renal failure, shock. PCCM asked to admit.  LINES / TUBES: 12-11 OTT>>> 12-11 rt rad aline>>> 12-11 lt ij  CVL>>>  CULTURES: 12-11 bc x 2>>> 12-11 flu panel +h1n1>>Influenza A positive. 12-11 bal>>> 12/11 Urine>>>NTD  ANTIBIOTICS: 12-11 vanco>> 12-11 zoysn>>12-11 12-11 fortaz>> 12-11 levaquin>> 12-11 tamiflu>>  SIGNIFICANT EVENTS:  12-11 difficut intubation 12/11 bronch DAH  LEVEL OF CARE:  ICU PRIMARY SERVICE:  PCCM CONSULTANTS:   CODE STATUS FULL DIET:  npo DVT Px:  pas GI Px:  ppi  VITAL SIGNS: Temp:  [98.1 F (36.7 C)-100 F (37.8 C)] 99 F (37.2 C) (12/13 0900) Pulse Rate:  [94-111] 106  (12/13 0900) Resp:  [19-31] 27  (12/13 0900) BP: (94-125)/(42-72) 100/54 mmHg (12/13 0800) SpO2:  [88 %-97 %] 91 % (12/13 0900) FiO2 (%):  [40 %-70 %] 40 % (12/13 0808) Weight:  [87.9 kg (193 lb 12.6 oz)] 87.9 kg (193 lb 12.6 oz) (12/13 0400) HEMODYNAMICS: CVP:  [15 mmHg-16 mmHg] 16 mmHg VENTILATOR SETTINGS: Vent Mode:  [-] PRVC FiO2 (%):  [40 %-70 %] 40 % Set Rate:  [32 bmp] 32 bmp Vt Set:  [360 mL] 360 mL PEEP:  [5 cmH20-10 cmH20] 5 cmH20 Plateau Pressure:  [12 cmH20-27 cmH20] 12 cmH20 INTAKE / OUTPUT: Intake/Output      12/12 0701 - 12/13 0700 12/13 0701 - 12/14 0700   I.V. (mL/kg) 1503 (17.1)    NG/GT 690    IV Piggyback 250    Total Intake(mL/kg) 2443 (27.8)    Urine (mL/kg/hr) 835 (0.4)    Total Output 835    Net +1608         Stool Occurrence 2 x     PHYSICAL EXAMINATION: General:  Obese downs syndrome in resp distress Neuro:  Sedated and unresponsive.  HEENT:  Severe overbite Cardiovascular:  hsr rrr Lungs:  Coarse rhonchi  bilateral Abdomen:  obese Musculoskeletal:  intact Skin:  warm  LABS: Cbc  Lab 10/17/12 0445 10/16/12 0520 10/15/12 2220  WBC 7.8 -- --  HGB 10.0* 9.5* 9.8*  HCT 30.7* 28.6* 30.3*  PLT 60* 44* 42*   Chemistry  Lab 10/17/12 0445 10/16/12 0520 10/15/12 2220 10/15/12 1125  NA 143 141 139 --  K 4.7 4.1 4.4 --  CL 113* 109 108 --  CO2 22 18* 20 --  BUN 36* 32* 34* --  CREATININE 1.90* 2.25* 2.45* --  CALCIUM 6.8* 6.7* 6.5* --  MG 2.4 2.0 -- 2.7*  PHOS 2.2* 2.8 -- 6.5*  GLUCOSE 164* 126* 107* --   Liver fxn  Lab 10/15/12 1030  AST 132*  ALT 48*  ALKPHOS 39  BILITOT 0.2*  PROT 7.1  ALBUMIN 2.2*   coags  Lab 10/16/12 0520 10/15/12 1125  APTT 32 34  INR 1.04 0.98   Sepsis markers  Lab 10/15/12 2220 10/15/12 1124 10/15/12 1030  LATICACIDVEN -- 2.5* --  PROCALCITON 1.58 -- 4.08   Cardiac markers No results found for this basename: CKTOTAL:3,CKMB:3,TROPONINI:3 in the last 168 hours BNP No results found for this basename: PROBNP:3 in the last 168 hours ABG  Lab 10/17/12 0508 10/16/12 0951 10/16/12 0501  PHART 7.371 7.391 7.384  PCO2ART 38.1 29.1* 30.3*  PO2ART 62.0* 92.2 88.0  HCO3 21.5 17.3* 17.4*  TCO2 20.1 16.3 16.3   CBG trend  Lab 10/17/12 0746 10/16/12 1608 10/15/12 1624 10/15/12 1007  GLUCAP 124* 168* 99 114*   IMAGING: US Renal Port  10/15/2012  *RADIOLOGY REPORT*  Clinical Data: Renal failure.  Downs syndrome.  RENAL/URINARY TRACT ULTRASOUND COMPLETE - PORTABLE  Comparison:  None.  Findings:  Right Kidney:  No hydronephrosis.  Well-preserved cortex.  Normal size and parenchymal echotexture without focal abnormalities. Renal length 9.9 cm.  Left Kidney:  No hydronephrosis.  Well-preserved cortex.  Normal size and parenchymal echotexture without focal abnormalities. Renal length 9.7 cm.  Bladder:  Decompressed by Foley catheter and suboptimally evaluated.  IMPRESSION:  1.  The kidneys are symmetric in size without focal abnormality. 2.  No  hydronephrosis.  Bladder decompressed by Foley catheter.   Original Report Authenticated By: Carey Bullocks, M.D.    Dg Chest Port 1 View  10/17/2012  *RADIOLOGY REPORT*  Clinical Data: Endotracheal tube position  PORTABLE CHEST - 1 VIEW  Comparison: 10/16/2012  Findings: Endotracheal tube tip 2.7 cm proximal to the carina. Left IJ catheter tip projects over the proximal SVC.  NG tube descends below level of the image.  The patient is rotated.  There are perihilar and bibasilar opacities.  Moderate left and small right pleural effusions.  No definite pneumothorax.  No acute osseous finding.  IMPRESSION: Cardiomegaly with pulmonary edema versus multifocal infection.  Bilateral pleural effusions with associated consolidations, left greater right  Support devices as above.   Original Report Authenticated By: Jearld Lesch, M.D.    Dg Chest Port 1 View  10/16/2012  *RADIOLOGY REPORT*  Clinical Data: Endotracheal tube placement  PORTABLE CHEST - 1 VIEW  Comparison: Chest radiograph 10/15/2012 no  Findings: Endotracheal tube and left central venous line are unchanged.  Interval placement NG tube which extends to the stomach.  Stable heart silhouette.  There is dense bibasilar air space disease unchanged from prior.  No pneumothorax.  IMPRESSION:  1.  Bibasilar dense air space disease not changed from prior. 2.  Interval placement NG tube into the stomach.   Original Report Authenticated By: Genevive Bi, M.D.    Dg Chest Port 1 View  10/15/2012  *RADIOLOGY REPORT*  Clinical Data: Intubation.  Central line placement  PORTABLE CHEST - 1 VIEW  Comparison: 10/15/2012  Findings: Endotracheal tube in good position.  Left jugular catheter tip in the SVC.  No pneumothorax.  Extensive bilateral airspace disease shows mild improvement from earlier today.  This could be due to pneumonia or heart failure.  IMPRESSION: Endotracheal tube in good position.  Central venous catheter tip in good position.  Diffuse  bilateral infiltrate or edema is slightly improved.   Original Report Authenticated By: Janeece Riggers, M.D.    Dg Chest Portable 1 View  10/15/2012  *RADIOLOGY REPORT*  Clinical Data: Cough.  Shortness of breath.  Hypoxemia.  Lethargy. Hypotension.  PORTABLE CHEST - 1 VIEW 10/15/2012 1201 hours:  Comparison: None.  Findings: Cardiac silhouette upper normal in size for the AP portable technique.  Airspace consolidation throughout the mid and lower lungs bilaterally, most confluent at the right base.  IMPRESSION: Bilateral pneumonia.   Original Report Authenticated By: Hulan Saas, M.D.    ECG:  DIAGNOSES: Active Problems:  ARDS (adult respiratory distress syndrome)  Septic shock(785.52)  Acute respiratory failure with hypoxia  Pulmonary alveolar hemorrhage  ASSESSMENT / PLAN:  PULMONARY  ASSESSMENT: VDRF secondary to ARDS due to influenza A. Very difficult airway. Hempotysis = DAH, doubtful.  More likely to be influenza A related. R/o FLu, Bacterial PNA, staph? Strep? PLAN:   - ARDS protocol to plat less 25/30.  Down to 40%/5. - Wean O2 per protocol. - F/U BAL. - F/U auto immune work up compliment, anca, anti gbm etc all pending. - ABG now. - If major declines will start empric steroids but holding for now.  But no stress dose steroids, cortisol level of 97.8. - Continue tamiflu. - Begin PS trials. - Gentle diureses today.  CARDIOVASCULAR  ASSESSMENT:  Shock(presumed septic with elevated procal) PLAN:  - Sepsis protocol 12-11 1300 - EGDT complete. - 2D echo 55% to 60%. - Lactic reviewed - D/C stress dose steroids. - TSH noted.  RENAL Lab Results  Component Value Date   CREATININE 1.90* 10/17/2012   CREATININE 2.25* 10/16/2012   CREATININE 2.45* 10/15/2012   ASSESSMENT:  ARF, r/o pulm renal syndrome ARF PLAN:   - Decrease IVF to 50 ml/hr and start TF. - ESR, ANCA, Anti GBM, ANA, dsDNA all pending. - Monitor creatine. - Renal US negative. - Lasix 20 q6 x3. -  Na3PO4 given.  GASTROINTESTINAL  ASSESSMENT:   NPO PLAN:   PPI Nutrition consult for TF.  HEMATOLOGIC  ASSESSMENT:  R/o pulm / renal syndrime Hempotysis PLAN:  - Daily CBC. - SCDs. - Auto immune work up pending.  INFECTIOUS  ASSESSMENT:   Presumed HCAp PNA (from group), empric tx for Flu, r/o superinfection PLAN:   - Vanc, levo, ceftaz. - Tamiflu. - F/U on culture.  ENDOCRINE  ASSESSMENT:  At risk hyerpglycemia PLAN:   SSI. D/C hydrocortisone. TSH.  NEUROLOGIC  ASSESSMENT: Encephalopathic, dyschrony, most likely from renal failure, acidosis, sepsis PLAN:   - Continue valproic acid and seroquel.  - Fent drip. - Intermittent versed. - ?baseline.  CLINICAL SUMMARY:  26 yo downs syndrome, tx for pna, flu, renal failure, sepsis, ARDS. R/o pulm / renal  I have personally obtained a history, examined the patient, evaluated laboratory and imaging results, formulated the assessment and plan and placed orders.  CRITICAL CARE: The patient is critically ill with multiple organ systems failure and requires high complexity decision making for assessment and support, frequent evaluation and titration of therapies, application of advanced monitoring technologies and extensive interpretation of multiple databases. Critical Care Time devoted to patient care services described in this note is 35 minutes.   Rush Farmer, M.D. [facility] Pulmonary/Critical Care Medicine. Pager: [phone]. After hours pager: [phone].

## 2012-10-17 NOTE — Significant Event (Signed)
A line non functional.  Will have removed.  Coralyn Helling, MD 10/17/2012, 5:22 PM [phone]

## 2012-10-17 NOTE — ED Provider Notes (Signed)
Medical screening examination/treatment/procedure(s) were conducted as a shared visit with non-physician practitioner(s) and myself.  I personally evaluated the patient during the encounter Jones Skene, M.D.  Level V Caveat for AMS and Trisomy 21  Jaquanda Stoney Bang is a 26 y.o. female presents in respiratory distress and shock from group home, treated for pneumonia earlier in week with Z-pack, pt continued to deteriorate.  Mother accompanies pt - she last saw patient 3-4 weeks ago and she was well at the time.  Patient has been having trouble breathing, coughing - symptoms are severe worsening and associated with AMS over last day.  ROS: Level V Caveat for AMS and Trisomy 21  VITAL SIGNS:   Filed Vitals:   10/17/12 2000  BP: 104/71  Pulse: 106  Temp: 98.8 F (37.1 C)  Resp: 19   CONSTITUTIONAL: Somnolent, garbled speech, appears toxic HENT: Atraumatic, Trisomy facies, Mallampati 4, large tongue, oral mucosa pink and dry, airway sounds patent - cannot see pharynx. Nares patent without drainage. External ears normal. EYES: Conjunctiva clear, EOMI, PERRLA NECK: Trachea midline, non-tender, supple CARDIOVASCULAR: Tachycardic, Normal rhythm PULMONARY/CHEST: Decreased bilaterally, worse in bases, rhonchi and rales in the bases ABDOMINAL: Non-distended, soft, non-tender, no guarding.  BS normal. NEUROLOGIC: Non-focal, moving all four extremities, no gross sensory or motor deficits. EXTREMITIES: No clubbing, cyanosis, or edema SKIN: Warm, Dry, No erythema, No rash  MDM: Abagael Kramm is a 26 y.o. female seen initially by PA reported as toxic and septic, PA called code sepsis immediately.  Evaluated pt and treated for HAP with group home dynamic and failed treatment on azithromycin.  Excellent peripheral access.  PT fluid responsive.  Labs show septic picture.  Pt requires intubation - based on anticipated difficult airway, discussed with Critical care team to intubate in ICU - ICU team  agrees.   CRITICAL CARE Performed by: Jones Skene   Total critical care time: 30  Critical care time was exclusive of separately billable procedures and treating other patients.  Critical care was necessary to treat or prevent imminent or life-threatening deterioration.  Critical care was time spent personally by me on the following activities: development of treatment plan with patient and/or surrogate as well as nursing, discussions with consultants, evaluation of patient's response to treatment, examination of patient, obtaining history from patient or surrogate, ordering and performing treatments and interventions, ordering and review of laboratory studies, ordering and review of radiographic studies, pulse oximetry and re-evaluation of patient's condition.   Jones Skene, MD 10/17/12 2110

## 2012-10-18 ENCOUNTER — Inpatient Hospital Stay (HOSPITAL_COMMUNITY): Payer: Medicaid Other

## 2012-10-18 LAB — BASIC METABOLIC PANEL
CO2: 25 mEq/L (ref 19–32)
Chloride: 116 mEq/L — ABNORMAL HIGH (ref 96–112)
Creatinine, Ser: 1.71 mg/dL — ABNORMAL HIGH (ref 0.50–1.10)
GFR calc Af Amer: 46 mL/min — ABNORMAL LOW (ref >90)
Potassium: 3.3 mEq/L — ABNORMAL LOW (ref 3.5–5.1)

## 2012-10-18 LAB — BLOOD GAS, ARTERIAL
Acid-base deficit: 0.9 mmol/L (ref 0.0–2.0)
Bicarbonate: 22.7 mEq/L (ref 20.0–24.0)
FIO2: 0.4 %
O2 Saturation: 96.7 %
Patient temperature: 98.6
TCO2: 20.8 mmol/L (ref 0–100)
pO2, Arterial: 81.8 mmHg (ref 80.0–100.0)

## 2012-10-18 LAB — GLUCOSE, CAPILLARY
Glucose-Capillary: 118 mg/dL — ABNORMAL HIGH (ref 70–99)
Glucose-Capillary: 116 mg/dL — ABNORMAL HIGH (ref 70–99)
Glucose-Capillary: 138 mg/dL — ABNORMAL HIGH (ref 70–99)
Glucose-Capillary: 176 mg/dL — ABNORMAL HIGH (ref 70–99)

## 2012-10-18 LAB — CBC
MCV: 94.4 fL (ref 78.0–100.0)
Platelets: 58 10*3/uL — ABNORMAL LOW (ref 150–400)
RBC: 3.24 MIL/uL — ABNORMAL LOW (ref 3.87–5.11)
RDW: 16.6 % — ABNORMAL HIGH (ref 11.5–15.5)
WBC: 8.2 10*3/uL (ref 4.0–10.5)

## 2012-10-18 LAB — MAGNESIUM: Magnesium: 2.2 mg/dL (ref 1.5–2.5)

## 2012-10-18 LAB — CULTURE, BAL-QUANTITATIVE W GRAM STAIN
Colony Count: >=100000
Special Requests: NORMAL

## 2012-10-18 LAB — CULTURE, RESPIRATORY W GRAM STAIN

## 2012-10-18 LAB — PHOSPHORUS: Phosphorus: 2.3 mg/dL (ref 2.3–4.6)

## 2012-10-18 LAB — CALCIUM, IONIZED: Calcium, Ion: 1.13 mmol/L (ref 1.12–1.32)

## 2012-10-18 MED ORDER — MIDAZOLAM HCL 2 MG/2ML IJ SOLN: 1.0000 mg | INTRAMUSCULAR | Status: DC | PRN

## 2012-10-18 MED ORDER — LEVOFLOXACIN IN D5W 750 MG/150ML IV SOLN
750.0000 mg | INTRAVENOUS | Status: AC
  Administered 2012-10-18 – 2012-10-23 (×6): 750 mg via INTRAVENOUS
  Filled 2012-10-18 (×6): qty 150

## 2012-10-18 MED ORDER — POTASSIUM CHLORIDE 20 MEQ/15ML (10%) PO LIQD
40.0000 meq | Freq: Once | ORAL | Status: AC
  Administered 2012-10-18: 40 meq
  Filled 2012-10-18: qty 30

## 2012-10-18 MED ORDER — DEXTROSE 5 % IV SOLN
2.0000 g | Freq: Three times a day (TID) | INTRAVENOUS | Status: DC
  Administered 2012-10-18 – 2012-10-19 (×4): 2 g via INTRAVENOUS
  Filled 2012-10-18 (×5): qty 2

## 2012-10-18 NOTE — Progress Notes (Signed)
ANTIBIOTIC CONSULT NOTE  Pharmacy Consult for Vanc/Ceftazidime/Levaquin Indication: Sepsis, Pneumonia  No Known Allergies  Patient Measurements: Height: 5\' 6"  (167.6 cm) Weight: 193 lb 5.5 oz (87.7 kg) IBW/kg (Calculated) : 59.3   Vital Signs: Temp: 98.8 F (37.1 C) (12/14 0800) Temp src: Core (Comment) (12/14 0800) BP: 116/62 mmHg (12/14 0800) Pulse Rate: 107  (12/14 0937)  Intake/Output from previous day: 12/13 0701 - 12/14 0700 In: 2382.7 [I.V.:68.7; NG/GT:1200; IV Piggyback:814] Out: 3025 [Urine:3025]  Labs:  [facility] 10/18/12 0400 10/17/12 0445 10/16/12 0520  WBC 8.2 7.8 6.0  HGB 9.9* 10.0* 9.5*  PLT 58* 60* 44*  LABCREA -- -- --  CREATININE 1.71* 1.90* 2.25*   Estimated Creatinine Clearance: 54.7 ml/min (by C-G formula based on Cr of 1.71).   Microbiology: 12/11 blood x 2: ngtd 12/11 urine: NG(f) 12/11 trach asp: rare GNR/GPR 12/11 BAL: rare GPR/GPC pairs 12/11 urine strep/legionella: neg/pending 12/11 RSV: influenza A+, B- 12/11 H1N1: DETECTED  Assessment: 26 YOF with Down's and mild MR presents with AMS, NV. Seen by PCP 3 days prior to admission, started on Zpack for URI.  Admitted 12/11 to ICU for r/o sepsis, possible PNA. SCr continues to improve since admission. CrCl now ~ 63ml/min. Today is Day #4 Vanco, ceftazidime, and Levaquin. Will renally adjust abx.   Goal of Therapy:  Vancomycin trough level 15-20 mcg/ml Ceftazidime/Levaquin doses per renal function  Plan:  1) Increase ceftazidime to 2g IV q8h 2) Increase Leavquin to 750mg  IV q24h 3) No changes to Vanco at this time - if continues much longer, will check a trough.  If renal function continues to improve, consider increasing Tamiflu to 150mg  BID for duration of therapy.  Darrol Angel, PharmD Pager: [phone] 10/18/2012,9:55 AM

## 2012-10-18 NOTE — Progress Notes (Signed)
eLink Physician-Brief Progress Note Patient Name: Margaret Cooper DOB: Jun 21, 1986 MRN: [id number]  Date of Service  10/18/2012   HPI/Events of Note   Hypokalemia  eICU Interventions  Potassium replaced   Intervention Category Minor Interventions: Electrolytes abnormality - evaluation and management  Yasmina Chico 10/18/2012, 5:01 AM

## 2012-10-18 NOTE — Progress Notes (Signed)
eLink Physician-Brief Progress Note Patient Name: Margaret Cooper DOB: 07/19/1986 MRN: [id number]  Date of Service  10/18/2012   HPI/Events of Note     eICU Interventions  Dc versed gtt Use fent gtt, int versed prn only   Intervention Category Major Interventions: Other:  ALVA,RAKESH V. 10/18/2012, 3:34 PM

## 2012-10-18 NOTE — Progress Notes (Signed)
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 Presented to [facility] ED 12-11 in acute resp distress, renal failure, shock. PCCM asked to admit.  LINES / TUBES: 12-11 OTT>>> 12-11 rt rad aline>>> 10/17/12 12-11 lt ij  CVL>>>  CULTURES: 12-11 bc x 2>>> 12-11 flu panel +h1n1>>Influenza A positive. 12-11 bal>>> 12/11 Urine>>>NTD Results for orders placed during the hospital encounter of 10/15/12  URINE CULTURE     Status: Normal   Collection Time   10/15/12 11:22 AM      Component Value Range Status Comment   Specimen Description URINE, CATHETERIZED   Final    Special Requests NONE   Final    Culture  Setup Time 10/16/2012 01:26   Final    Colony Count NO GROWTH   Final    Culture NO GROWTH   Final    Report Status 10/17/2012 FINAL   Final   CULTURE, BLOOD (ROUTINE X 2)     Status: Normal (Preliminary result)   Collection Time   10/15/12 11:24 AM      Component Value Range Status Comment   Specimen Description BLOOD RIGHT ARM   Final    Special Requests BOTTLES DRAWN AEROBIC AND ANAEROBIC 3CC   Final    Culture  Setup Time 10/15/2012 19:57   Final    Culture     Final    Value:        BLOOD CULTURE RECEIVED NO GROWTH TO DATE CULTURE WILL BE HELD FOR 5 DAYS BEFORE ISSUING A FINAL NEGATIVE REPORT   Report Status PENDING   Incomplete   CULTURE, BLOOD (ROUTINE X 2)     Status: Normal (Preliminary result)   Collection Time   10/15/12 11:25 AM      Component Value Range Status Comment   Specimen Description BLOOD RIGHT ARM   Final    Special Requests BOTTLES DRAWN AEROBIC AND ANAEROBIC 3CC   Final    Culture  Setup Time 10/15/2012 19:57   Final    Culture     Final    Value:        BLOOD CULTURE RECEIVED NO GROWTH TO DATE CULTURE WILL BE HELD FOR 5 DAYS BEFORE  ISSUING A FINAL NEGATIVE REPORT   Report Status PENDING   Incomplete   CULTURE, BAL-QUANTITATIVE     Status: Normal (Preliminary result)   Collection Time   10/15/12  2:21 PM      Component Value Range Status Comment   Specimen Description BRONCHIAL ALVEOLAR LAVAGE   Final    Special Requests Normal   Final    Gram Stain     Final    Value: FEW WBC PRESENT,BOTH PMN AND MONONUCLEAR     FEW SQUAMOUS EPITHELIAL CELLS PRESENT     RARE GRAM POSITIVE RODS     RARE GRAM POSITIVE COCCI IN PAIRS   Colony Count PENDING   Incomplete    Culture Non-Pathogenic Oropharyngeal-type Flora Isolated.   Final    Report Status PENDING   Incomplete   MRSA PCR SCREENING     Status: Normal   Collection Time   10/15/12  3:36 PM      Component Value Range Status Comment   MRSA by PCR NEGATIVE  NEGATIVE Final   RESPIRATORY VIRUS PANEL     Status: Abnormal   Collection Time   10/15/12  4:00 PM      Component Value Range Status Comment   Source - RVPAN BRONCHIAL ALVEOLAR LAVAGE   Corrected CORRECTED ON 12/12 AT 2148: PREVIOUSLY REPORTED AS BRONCHIAL ALVEOLAR LAVAGE   Respiratory Syncytial Virus A NOT DETECTED   Final    Respiratory Syncytial Virus B NOT DETECTED   Final    Influenza A DETECTED (*)  Final    Influenza B NOT DETECTED   Final    Parainfluenza 1 NOT DETECTED   Final    Parainfluenza 2 NOT DETECTED   Final    Parainfluenza 3 NOT DETECTED   Final    Metapneumovirus NOT DETECTED   Final    Rhinovirus NOT DETECTED   Final    Adenovirus NOT DETECTED   Final    Influenza A H1 NOT DETECTED   Final    Influenza A H3 NOT DETECTED   Final   CULTURE, RESPIRATORY     Status: Normal (Preliminary result)   Collection Time   10/15/12  4:34 PM      Component Value Range Status Comment   Specimen Description TRACHEAL ASPIRATE   Final    Special Requests Normal   Final    Gram Stain     Final    Value: RARE WBC PRESENT,BOTH PMN AND MONONUCLEAR     FEW SQUAMOUS EPITHELIAL CELLS PRESENT     RARE GRAM  NEGATIVE RODS     RARE GRAM POSITIVE RODS   Culture Culture reincubated for better growth   Final    Report Status PENDING   Incomplete      ANTIBIOTICS: 12-11 vanco>> 12-11 zoysn>>12-11 12-11 fortaz>> 12-11 levaquin>> 12-11 tamiflu>>  Anti-infectives     Start     Dose/Rate Route Frequency Ordered Stop   10/16/12 2359   vancomycin (VANCOCIN) 750 mg in sodium chloride 0.9 % 150 mL IVPB        750 mg 150 mL/hr over 60 Minutes Intravenous Every 12 hours 10/16/12 1139     10/16/12 2200   oseltamivir (TAMIFLU) 6 MG/ML suspension 75 mg        75 mg Per Tube 2 times daily 10/16/12 1141 10/20/12 2159   10/16/12 1400   cefTAZidime (FORTAZ) 2 g in dextrose 5 % 50 mL IVPB        2 g 100 mL/hr over 30 Minutes Intravenous Every 12 hours 10/16/12 1139     10/16/12 1200   vancomycin (VANCOCIN) IVPB 1000 mg/200 mL premix  Status:  Discontinued        1,000 mg 200 mL/hr over 60 Minutes Intravenous Every 24 hours 10/15/12 1315 10/16/12 1138   10/15/12 2000   piperacillin-tazobactam (ZOSYN) IVPB 3.375 g  Status:  Discontinued        3.375 g 12.5 mL/hr over 240 Minutes Intravenous Every 8 hours 10/15/12 1315 10/15/12 1358   10/15/12 2000   cefTAZidime (FORTAZ) 2 g in dextrose 5 % 50 mL IVPB  Status:  Discontinued        2 g 100 mL/hr over 30 Minutes Intravenous Every 24 hours 10/15/12 1401 10/16/12 1139   10/15/12 1600   oseltamivir (TAMIFLU) capsule 75 mg  Status:  Discontinued        75 mg Oral 2 times daily 10/15/12 1408 10/16/12 1141   10/15/12 1500   levofloxacin (LEVAQUIN) IVPB 750 mg  750 mg 100 mL/hr over 90 Minutes Intravenous Every 48 hours 10/15/12 1401     10/15/12 1300   piperacillin-tazobactam (ZOSYN) IVPB 3.375 g  Status:  Discontinued        3.375 g 100 mL/hr over 30 Minutes Intravenous  Once 10/15/12 1254 10/15/12 1258   10/15/12 1130   vancomycin (VANCOCIN) IVPB 1000 mg/200 mL premix        1,000 mg 200 mL/hr over 60 Minutes Intravenous  Once 10/15/12 1110  10/15/12 1235   10/15/12 1130  piperacillin-tazobactam (ZOSYN) IVPB 3.375 g    Comments: GIVE AFTER BLOOD CULTURES COLLECTED     3.375 g 12.5 mL/hr over 240 Minutes Intravenous  Once 10/15/12 1110 10/15/12 1536          Lab 10/15/12 2220 10/15/12 1030  PROCALCITON 1.58 4.08     SIGNIFICANT EVENTS:  12-11 difficut intubation 12/11 bronch DAH    SUBJECTIVE/OVERNIGHT/INTERVAL HX  10/18/2012: Afebrile. Went back on sedatino gtt overnight. Now on prn sedation. Mild agitation on WUA. SBT starting imminently   LEVEL OF CARE:  ICU PRIMARY SERVICE:  PCCM CONSULTANTS:   CODE STATUS FULL DIET:  npo DVT Px:  pas GI Px:  ppi  VITAL SIGNS: Temp:  [96.4 F (35.8 C)-99 F (37.2 C)] 98.8 F (37.1 C) (12/14 0800) Pulse Rate:  [103-109] 108  (12/14 0800) Resp:  [11-32] 30  (12/14 0800) BP: (100-118)/(53-82) 116/62 mmHg (12/14 0800) SpO2:  [89 %-96 %] 96 % (12/14 0800) FiO2 (%):  [40 %] 40 % (12/14 0800) Weight:  [87.7 kg (193 lb 5.5 oz)] 87.7 kg (193 lb 5.5 oz) (12/14 0500) HEMODYNAMICS:   VENTILATOR SETTINGS: Vent Mode:  [-] PRVC FiO2 (%):  [40 %] 40 % Set Rate:  [32 bmp] 32 bmp Vt Set:  [360 mL] 360 mL PEEP:  [5 cmH20] 5 cmH20 Plateau Pressure:  [11 cmH20-23 cmH20] 20 cmH20 INTAKE / OUTPUT: Intake/Output      12/13 0701 - 12/14 0700 12/14 0701 - 12/15 0700   I.V. (mL/kg) 68.7 (0.8)    Other 300 20   NG/GT 1200 50   IV Piggyback 814    Total Intake(mL/kg) 2382.7 (27.2) 70 (0.8)   Urine (mL/kg/hr) 3025 (1.4) 225   Total Output 3025 225   Net -642.4 -155        Stool Occurrence 4 x     PHYSICAL EXAMINATION: General:  Obese downs syndrome in resp distress Neuro:  RASS+2 on WUA. No CAM-ICU assessment due to Down Syndrome HEENT:  Severe overbite Cardiovascular:  hsr rrr Lungs:  Coarse bilateral Abdomen:  obese Musculoskeletal:  intact Skin:  warm  LABS: Cbc  Lab 10/18/12 0400 10/17/12 0445 10/16/12 0520  WBC 8.2 -- --  HGB 9.9* 10.0* 9.5*  HCT 30.6* 30.7*  28.6*  PLT 58* 60* 44*   Chemistry  Lab 10/18/12 0400 10/17/12 0445 10/16/12 0520  NA 148* 143 141  K 3.3* 4.7 4.1  CL 116* 113* 109  CO2 25 22 18*  BUN 29* 36* 32*  CREATININE 1.71* 1.90* 2.25*  CALCIUM 7.0* 6.8* 6.7*  MG 2.2 2.4 2.0  PHOS 2.3 2.2* 2.8  GLUCOSE 134* 164* 126*   Liver fxn  Lab 10/15/12 1030  AST 132*  ALT 48*  ALKPHOS 39  BILITOT 0.2*  PROT 7.1  ALBUMIN 2.2*   coags  Lab 10/16/12 0520 10/15/12 1125  APTT 32 34  INR 1.04 0.98   Sepsis markers  Lab 10/15/12 2220 10/15/12 1124 10/15/12 1030  LATICACIDVEN -- 2.5* --  PROCALCITON 1.58 -- 4.08   Cardiac markers No results found for this basename: CKTOTAL:3,CKMB:3,TROPONINI:3 in the last 168 hours BNP No results found for this basename: PROBNP:3 in the last 168 hours ABG  Lab 10/18/12 0423 10/17/12 0508 10/16/12 0951  PHART 7.422 7.371 7.391  PCO2ART 35.5 38.1 29.1*  PO2ART 81.8 62.0* 92.2  HCO3 22.7 21.5 17.3*  TCO2 20.8 20.1 16.3   CBG trend  Lab 10/18/12 0740 10/18/12 0353 10/18/12 0011 10/17/12 1927 10/17/12 1631  GLUCAP 116* 133* 118* 136* 151*   IMAGING: Dg Chest Port 1 View  10/18/2012  *RADIOLOGY REPORT*  Clinical Data: Endotracheal tube placement  PORTABLE CHEST - 1 VIEW  Comparison: Prior chest x-ray 10/17/2012  Findings: Tip of endotracheal tube is 3.8 cm above the carina. Unchanged position of left IJ central venous catheter.  Catheter tip projects over the lower SVC.  Incompletely imaged nasogastric tube, the tip lies below the diaphragm likely within the stomach.  Slightly improved aeration.  Persistent mildly enlarged globular contour of the cardiopericardial silhouette with a layering left pleural effusion and diffuse bilateral interstitial and airspace opacities most consistent with pulmonary edema and / or multi focal infection. No acute osseous abnormality.  IMPRESSION:  1.  Stable and satisfactory support apparatus. 2.  Slightly improved aeration over the last 24 hours. 3.   Persistent mildly globular enlargement of the cardiopericardial silhouette which may represent cardiomegaly or pericardial effusion. 4.  Suspect small left pleural effusion 5.  Diffuse bilateral interstitial and airspace opacities slightly more confluent in the left base than the right remain consistent with pulmonary edema versus multi focal infection.   Original Report Authenticated By: Malachy Moan, M.D.    Dg Chest Port 1 View  10/17/2012  *RADIOLOGY REPORT*  Clinical Data: Endotracheal tube position  PORTABLE CHEST - 1 VIEW  Comparison: 10/16/2012  Findings: Endotracheal tube tip 2.7 cm proximal to the carina. Left IJ catheter tip projects over the proximal SVC.  NG tube descends below level of the image.  The patient is rotated.  There are perihilar and bibasilar opacities.  Moderate left and small right pleural effusions.  No definite pneumothorax.  No acute osseous finding.  IMPRESSION: Cardiomegaly with pulmonary edema versus multifocal infection.  Bilateral pleural effusions with associated consolidations, left greater right  Support devices as above.   Original Report Authenticated By: Jearld Lesch, M.D.    ECG:  DIAGNOSES: Active Problems:  ARDS (adult respiratory distress syndrome)  Septic shock(785.52)  Acute respiratory failure with hypoxia  Pulmonary alveolar hemorrhage  ASSESSMENT / PLAN:  PULMONARY  ASSESSMENT: VDRF secondary to ARDS due to influenza A. Very difficult airway. Hempotysis = DAH due to influenza A related ([employer] panel 10/15/12 negative) Strep urin Ag - negative  On 10/18/2012: Meets SBT criteria     PLAN:   - SBT as tolerated but no extubatino due to down, tough airway and agitation - - F/U BAL. -  Continue tamiflu (? Need 10 days). - Gentle diureses today.  CARDIOVASCULAR  ASSESSMENT:  Shock(presumed septic with elevated procal). Sp EGDT complete 10/15/12. ECHO ef 60% 10/18/2012: Not on pressors  PLAN:  - map goal >  65  RENAL Lab Results  Component Value Date   CREATININE 1.71* 10/18/2012   CREATININE 1.90* 10/17/2012   CREATININE 2.25* 10/16/2012   ASSESSMENT:  ARF due to sepsis. Autoimmne neg - pulm/renal syndrome ruled out.Negatie renal US 10/18/2012: improving creat    ARF PLAN:   -- Lasix  20 q6 x3.   GASTROINTESTINAL  ASSESSMENT:   NPO PLAN:   PPI Nutrition consult for TF.  HEMATOLOGIC  Lab 10/18/12 0400 10/17/12 0445 10/16/12 0520  HGB 9.9* 10.0* 9.5*  HCT 30.6* 30.7* 28.6*  WBC 8.2 7.8 6.0  PLT 58* 60* 44*    ASSESSMENT:   Anemia of critical illness Thrombocytopenia - ? Due to sepsis  PLAN:  - Daily CBC. - SCDs. - no heparin  INFECTIOUS  ASSESSMENT:   Presumed HCAp PNA (from group) due to Flu A  PLAN:   - Vanc, levo, ceftaz. - Tamiflu. - F/U on culture.  ENDOCRINE  ASSESSMENT:  At risk hyerpglycemia PLAN:   SSI.  TSH.  NEUROLOGIC  ASSESSMENT: Encephalopathic, dyschrony, most likely from renal failure, acidosis, sepsis On 10/18/2012: RASS +2 on WUA  PLAN:   - Continue valproic acid and seroquel.  - Fent drip. - Intermittent versed. - ?baseline.  CLINICAL SUMMARY:  26 yo downs syndrome, tx for pna, flu, renal failure, sepsis, ARDS   I CRITICAL CARE: The patient is critically ill with multiple organ systems failure and requires high complexity decision making for assessment and support, frequent evaluation and titration of therapies, application of advanced monitoring technologies and extensive interpretation of multiple databases. Critical Care Time devoted to patient care services described in this note is 35 minutes.     Dr. Kalman Shan, M.D., [facility].C.P Pulmonary and Critical Care Medicine Staff Physician [facility] Pager: [phone], If no answer or between  15:00h - 7:00h: call [phone]  10/18/2012 9:14 AM

## 2012-10-19 ENCOUNTER — Inpatient Hospital Stay (HOSPITAL_COMMUNITY): Payer: Medicaid Other

## 2012-10-19 LAB — BASIC METABOLIC PANEL
CO2: 26 mEq/L (ref 19–32)
Calcium: 7.5 mg/dL — ABNORMAL LOW (ref 8.4–10.5)
Creatinine, Ser: 1.4 mg/dL — ABNORMAL HIGH (ref 0.50–1.10)
GFR calc non Af Amer: 51 mL/min — ABNORMAL LOW (ref >90)
Glucose, Bld: 127 mg/dL — ABNORMAL HIGH (ref 70–99)
Sodium: 152 mEq/L — ABNORMAL HIGH (ref 135–145)

## 2012-10-19 LAB — LEGIONELLA ANTIGEN, URINE: Legionella Antigen, Urine: NEGATIVE

## 2012-10-19 LAB — PRO B NATRIURETIC PEPTIDE: Pro B Natriuretic peptide (BNP): 832.7 pg/mL — ABNORMAL HIGH (ref 0–125)

## 2012-10-19 LAB — GLUCOSE, CAPILLARY
Glucose-Capillary: 127 mg/dL — ABNORMAL HIGH (ref 70–99)
Glucose-Capillary: 146 mg/dL — ABNORMAL HIGH (ref 70–99)
Glucose-Capillary: 111 mg/dL — ABNORMAL HIGH (ref 70–99)

## 2012-10-19 LAB — CBC
MCH: 30.8 pg (ref 26.0–34.0)
MCHC: 32.3 g/dL (ref 30.0–36.0)
MCV: 95.2 fL (ref 78.0–100.0)
Platelets: 57 10*3/uL — ABNORMAL LOW (ref 150–400)

## 2012-10-19 LAB — PROCALCITONIN: Procalcitonin: 0.34 ng/mL

## 2012-10-19 LAB — MAGNESIUM: Magnesium: 2.1 mg/dL (ref 1.5–2.5)

## 2012-10-19 LAB — LACTIC ACID, PLASMA: Lactic Acid, Venous: 1.2 mmol/L (ref 0.5–2.2)

## 2012-10-19 MED ORDER — FUROSEMIDE 10 MG/ML IJ SOLN: 20.0000 mg | Freq: Three times a day (TID) | INTRAMUSCULAR | Status: DC

## 2012-10-19 MED ORDER — POTASSIUM CHLORIDE 20 MEQ/15ML (10%) PO LIQD
20.0000 meq | Freq: Three times a day (TID) | ORAL | Status: DC
  Administered 2012-10-19 (×2): 20 meq
  Filled 2012-10-19 (×5): qty 15

## 2012-10-19 MED ORDER — POTASSIUM CHLORIDE 20 MEQ/15ML (10%) PO LIQD
20.0000 meq | Freq: Three times a day (TID) | ORAL | Status: DC
  Administered 2012-10-19: 20 meq
  Filled 2012-10-19 (×3): qty 15

## 2012-10-19 MED ORDER — FUROSEMIDE 10 MG/ML IJ SOLN
40.0000 mg | Freq: Three times a day (TID) | INTRAMUSCULAR | Status: DC
  Administered 2012-10-19 (×2): 40 mg via INTRAVENOUS
  Filled 2012-10-19 (×5): qty 4

## 2012-10-19 MED ORDER — POTASSIUM CHLORIDE 20 MEQ/15ML (10%) PO LIQD
40.0000 meq | Freq: Once | ORAL | Status: AC
  Administered 2012-10-19: 40 meq

## 2012-10-19 MED ORDER — FUROSEMIDE 10 MG/ML IJ SOLN
20.0000 mg | Freq: Three times a day (TID) | INTRAMUSCULAR | Status: DC
  Administered 2012-10-19: 20 mg via INTRAVENOUS
  Filled 2012-10-19: qty 2

## 2012-10-19 MED ORDER — POTASSIUM CHLORIDE 20 MEQ/15ML (10%) PO LIQD
40.0000 meq | Freq: Once | ORAL | Status: DC
  Filled 2012-10-19: qty 30

## 2012-10-19 MED ORDER — POTASSIUM CHLORIDE 20 MEQ/15ML (10%) PO LIQD
20.0000 meq | Freq: Once | ORAL | Status: DC
  Filled 2012-10-19: qty 15

## 2012-10-19 MED ORDER — DEXMEDETOMIDINE HCL IN NACL 200 MCG/50ML IV SOLN
0.2000 ug/kg/h | INTRAVENOUS | Status: DC
  Administered 2012-10-19: 0.5 ug/kg/h via INTRAVENOUS
  Administered 2012-10-19: 0.2 ug/kg/h via INTRAVENOUS
  Administered 2012-10-19: 0.4 ug/kg/h via INTRAVENOUS
  Filled 2012-10-19 (×4): qty 50

## 2012-10-19 MED ORDER — FREE WATER
200.0000 mL | Freq: Four times a day (QID) | Status: DC
  Administered 2012-10-19 – 2012-10-20 (×4): 200 mL

## 2012-10-19 MED ORDER — POTASSIUM PHOSPHATE DIBASIC 3 MMOLE/ML IV SOLN
15.0000 mmol | Freq: Once | INTRAVENOUS | Status: AC
  Administered 2012-10-19: 15 mmol via INTRAVENOUS
  Filled 2012-10-19: qty 5

## 2012-10-19 NOTE — Progress Notes (Signed)
Clinical Social Work Department BRIEF PSYCHOSOCIAL ASSESSMENT 10/19/2012  Patient:  Margaret Cooper,Margaret Cooper     Account Number:  [id number]     Admit date:  10/15/2012  Clinical Social Worker:  Leron Croak, CLINICAL SOCIAL WORKER  Date/Time:  10/19/2012 04:51 PM  Referred by:  Physician  Date Referred:  10/15/2012 Referred for  Other - See comment   Other Referral:   From a group home   Interview type:  Family Other interview type:   Family in room and mother on the phone    PSYCHOSOCIAL DATA Living Status:  OTHER Admitted from facility:   Level of care:   Primary support name:  Margaret Cooper Primary support relationship to patient:  PARENT Degree of support available:   Pt was in a group home and will be returning home with good support    CURRENT CONCERNS Current Concerns  Other - See comment   Other Concerns:    SOCIAL WORK ASSESSMENT / PLAN CSW met with the family at the bedside. Pt unable to participate with the assessment at this time. Family stated that they feel that the group home was the cause of the Pt becoming ill and that they would be taking the Pt back home at d/c and not to the group home.   Assessment/plan status:  Information/Referral to [facility] Other assessment/ plan:   Information/referral to community resources:   No information was needed at this time    PATIENT'S/FAMILY'S RESPONSE TO PLAN OF CARE: Pt's family were appreciative for assistance.        Leron Croak, LCSWA [employer] [phone]

## 2012-10-19 NOTE — Progress Notes (Signed)
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 Presented to [facility] ED 12-11 in acute resp distress, renal failure, shock. PCCM asked to admit.  LINES / TUBES: 12-11 OTT>>> 12-11 rt rad aline>>> 10/17/12 12-11 lt ij  CVL>>>  CULTURES: 12/11 MRSA PCR - neg 12-11 bc x 2>>> neg 12-11 flu panel +h1n1>>Influenza A positive. 12-11 bal>>> neg 12/11 Urine>>>NTD 12/11 Urine strep -neg 12/11 Urine leg - >> ?sent ............... 12/15 BAL 12/15 Blood 12/15 Urine 12/15  Lab 10/19/12 0440 10/15/12 2220 10/15/12 1030  PROCALCITON 0.34 1.58 4.08       ANTIBIOTICS: 12-11 vanco>>12/15 12-11 zoysn>>12-11 12-11 fortaz>>12/15 12-11 levaquin>> 12-11 tamiflu>> (5 days, ? Stop date present)    SIGNIFICANT EVENTS:  12-11 difficut intubation 12/11 bronch DAH 12/14 - agitated on WUA. Needed sedation gtt prior night   SUBJECTIVE/OVERNIGHT/INTERVAL HX  10/19/2012: Calmer but has New fever and cxr shows severe bilateral infilrates  LEVEL OF CARE:  ICU PRIMARY SERVICE:  PCCM CONSULTANTS:   CODE STATUS FULL DIET:  npo DVT Px:  pas GI Px:  ppi  VITAL SIGNS: Temp:  [98.4 F (36.9 C)-100.6 F (38.1 C)] 100.6 F (38.1 C) (12/15 0800) Pulse Rate:  [109-131] 114  (12/15 0800) Resp:  [12-32] 32  (12/15 0800) BP: (95-130)/(45-86) 121/68 mmHg (12/15 0800) SpO2:  [92 %-100 %] 93 % (12/15 0800) FiO2 (%):  [40 %-60 %] 60 % (12/15 1154) Weight:  [86.9 kg (191 lb 9.3 oz)] 86.9 kg (191 lb 9.3 oz) (12/15 0600) HEMODYNAMICS:   VENTILATOR SETTINGS: Vent Mode:  [-] PRVC FiO2 (%):  [40 %-60 %] 60 % Set Rate:  [32 bmp] 32 bmp Vt Set:  [360 mL] 360 mL PEEP:  [5 cmH20-10 cmH20] 10 cmH20 Pressure Support:  [5 cmH20-8 cmH20] 5 cmH20 Plateau Pressure:  [18 cmH20-25 cmH20] 25  cmH20 INTAKE / OUTPUT: Intake/Output      12/14 0701 - 12/15 0700 12/15 0701 - 12/16 0700   I.V. (mL/kg) 127.5 (1.5) 48 (0.6)   Other 480 60   NG/GT 1150 50   IV Piggyback 610 243   Total Intake(mL/kg) 2367.5 (27.2) 401 (4.6)   Urine (mL/kg/hr) 1145 (0.5) 850 (1.8)   Stool 0 1   Total Output 1145 851   Net +1222.5 -450        Stool Occurrence 4 x     PHYSICAL EXAMINATION: General:  Obese downs syndrome  Neuro:  RASS+1 on WUA. No CAM-ICU assessment due to Down Syndrome HEENT:  Severe overbite Cardiovascular:  hsr rrr Lungs:  Coarse bilateral Abdomen:  obese Musculoskeletal:  intact Skin:  warm  LABS: Cbc  Lab 10/19/12 0440 10/18/12 0400 10/17/12 0445  WBC 6.2 -- --  HGB 9.7* 9.9* 10.0*  HCT 30.0* 30.6* 30.7*  PLT 57* 58* 60*   Chemistry  Lab 10/19/12 0440 10/18/12 0400 10/17/12 0445  NA 152* 148* 143  K 3.3* 3.3* 4.7  CL 121* 116* 113*  CO2 26 25 22   BUN 21 29* 36*  CREATININE 1.40* 1.71* 1.90*  CALCIUM 7.5* 7.0* 6.8*  MG 2.1 2.2 2.4  PHOS 1.9* 2.3 2.2*  GLUCOSE 127* 134* 164*   Liver fxn  Lab 10/15/12 1030  AST 132*  ALT 48*  ALKPHOS 39  BILITOT 0.2*  PROT 7.1  ALBUMIN 2.2*   coags  Lab 10/16/12 0520 10/15/12 1125  APTT 32 34  INR 1.04 0.98   Sepsis markers  Lab 10/19/12 1022 10/19/12 0440 10/15/12 2220 10/15/12 1124 10/15/12 1030  LATICACIDVEN 1.2 -- -- 2.5* --  PROCALCITON -- 0.34 1.58 -- 4.08   Cardiac markers No results found for this basename: CKTOTAL:3,CKMB:3,TROPONINI:3 in the last 168 hours BNP  Lab 10/19/12 0440  PROBNP 832.7*   ABG  Lab 10/18/12 0423 10/17/12 0508 10/16/12 0951  PHART 7.422 7.371 7.391  PCO2ART 35.5 38.1 29.1*  PO2ART 81.8 62.0* 92.2  HCO3 22.7 21.5 17.3*  TCO2 20.8 20.1 16.3   CBG trend  Lab 10/19/12 1141 10/19/12 0745 10/19/12 0331 10/19/12 0005 10/18/12 2016  GLUCAP 111* 109* 146* 169* 132*   IMAGING: Dg Chest Port 1 View  10/19/2012  *RADIOLOGY REPORT*  Clinical Data: Check endotracheal tube   PORTABLE CHEST - 1 VIEW  Comparison: Chest radiograph 10/18/2012  Findings: Endotracheal tube, NG tube, and left central venous line are unchanged.  Stable heart silhouette.  There is a bibasilar air space disease which is diffuse in nature and not improved compared to prior.  Small bilateral pleural effusions.  No pneumothorax.  IMPRESSION: 1.  Stable support apparatus. 2.   No significant change. 3.  Bibasilar air space disease and effusions.   Original Report Authenticated By: Genevive Bi, M.D.    Dg Chest Port 1 View  10/18/2012  *RADIOLOGY REPORT*  Clinical Data: Endotracheal tube placement  PORTABLE CHEST - 1 VIEW  Comparison: Prior chest x-ray 10/17/2012  Findings: Tip of endotracheal tube is 3.8 cm above the carina. Unchanged position of left IJ central venous catheter.  Catheter tip projects over the lower SVC.  Incompletely imaged nasogastric tube, the tip lies below the diaphragm likely within the stomach.  Slightly improved aeration.  Persistent mildly enlarged globular contour of the cardiopericardial silhouette with a layering left pleural effusion and diffuse bilateral interstitial and airspace opacities most consistent with pulmonary edema and / or multi focal infection. No acute osseous abnormality.  IMPRESSION:  1.  Stable and satisfactory support apparatus. 2.  Slightly improved aeration over the last 24 hours. 3.  Persistent mildly globular enlargement of the cardiopericardial silhouette which may represent cardiomegaly or pericardial effusion. 4.  Suspect small left pleural effusion 5.  Diffuse bilateral interstitial and airspace opacities slightly more confluent in the left base than the right remain consistent with pulmonary edema versus multi focal infection.   Original Report Authenticated By: Malachy Moan, M.D.    ECG:  DIAGNOSES: Active Problems:  ARDS (adult respiratory distress syndrome)  Septic shock(785.52)  Acute respiratory failure with hypoxia  Pulmonary  alveolar hemorrhage  ASSESSMENT / PLAN:  PULMONARY  ASSESSMENT: VDRF secondary to ARDS due to influenza A. Very difficult airway. Hempotysis = DAH due to influenza A related ([employer] panel 10/15/12 negative) Strep urin Ag - negative  On 10/19/2012: Doing SBT     PLAN:   - SBT as tolerated but no extubatino due to low phos, seveer air space dz on cxr, and new fever in setting of tough airway   Continue tamiflu . - Ramp up  diureses 10/19/12   CARDIOVASCULAR  ASSESSMENT:  Shock(presumed septic with elevated procal). Sp EGDT complete 10/15/12. ECHO ef 60% 10/19/2012: Not on pressors  PLAN:  - map goal > 65  RENAL  Lab 10/19/12 0440 10/18/12 0400 10/17/12 0445 10/16/12 0520 10/15/12 2220 10/15/12 1125  NA 152*  148* 143 141 139 --  K 3.3* 3.3* -- -- -- --  CL 121* 116* 113* 109 108 --  CO2 26 25 22  18* 20 --  GLUCOSE 127* 134* 164* 126* 107* --  BUN 21 29* 36* 32* 34* --  CREATININE 1.40* 1.71* 1.90* 2.25* 2.45* --  CALCIUM 7.5* 7.0* 6.8* 6.7* 6.5* --  MG 2.1 2.2 2.4 2.0 -- 2.7*  PHOS 1.9* 2.3 2.2* 2.8 -- 6.5*     ASSESSMENT:  ARF due to sepsis. Autoimmne neg - pulm/renal syndrome ruled out.Negatie renal US  10/19/2012: improving creat and hypophis and hypernatremia    ARF PLAN:   - dc saline kvo - start free water Replete KCL   GASTROINTESTINAL  ASSESSMENT:   NPO PLAN:   PPI  TF.  HEMATOLOGIC  Lab 10/19/12 0440 10/18/12 0400 10/17/12 0445  HGB 9.7* 9.9* 10.0*  HCT 30.0* 30.6* 30.7*  WBC 6.2 8.2 7.8  PLT 57* 58* 60*    ASSESSMENT:   Anemia of critical illness Thrombocytopenia - ? Due to sepsis  On 10/19/12: unchanged values  PLAN:  - Daily CBC. - SCDs. - no heparin  INFECTIOUS  ASSESSMENT:   Presumed CAP v HCAP PNA (from group) due to Flu A and DAH due to flu  On 10/19/12 - new fever  PLAN:   - DC vanc and ceftaz - Cointinue levo - Tamiflu. - F/U on culture repeat 10/19/12.  ENDOCRINE  ASSESSMENT:  At risk  hyerpglycemia PLAN:   SSI.  TSH.  NEUROLOGIC  ASSESSMENT: Encephalopathic, dyschrony, most likely from renal failure, acidosis, sepsis On 10/19/2012: RASS +2 on WUA  PLAN:   - Continue valproic acid and seroquel.  - Fent drip. - Intermittent versed. - startr prcedex 10/19/12  CLINICAL SUMMARY:  26 yo downs syndrome, tx for pna, flu, renal failure, sepsis, ARDS   I CRITICAL CARE: The patient is critically ill with multiple organ systems failure and requires high complexity decision making for assessment and support, frequent evaluation and titration of therapies, application of advanced monitoring technologies and extensive interpretation of multiple databases. Critical Care Time devoted to patient care services described in this note is 35 minutes.     Dr. Kalman Shan, M.D., [facility].C.P Pulmonary and Critical Care Medicine Staff Physician [facility] [place] [facility] Critical [facility] Pager: [phone], If no answer or between  15:00h - 7:00h: call [phone]  10/19/2012 12:24 PM

## 2012-10-20 ENCOUNTER — Inpatient Hospital Stay (HOSPITAL_COMMUNITY): Payer: Medicaid Other

## 2012-10-20 DIAGNOSIS — J101 Influenza due to other identified influenza virus with other respiratory manifestations: Secondary | ICD-10-CM | POA: Diagnosis present

## 2012-10-20 LAB — URINE CULTURE: Special Requests: NORMAL

## 2012-10-20 LAB — CBC
HCT: 29.8 % — ABNORMAL LOW (ref 36.0–46.0)
MCH: 30.5 pg (ref 26.0–34.0)
MCV: 93.7 fL (ref 78.0–100.0)
Platelets: 61 10*3/uL — ABNORMAL LOW (ref 150–400)
RBC: 3.18 MIL/uL — ABNORMAL LOW (ref 3.87–5.11)
RDW: 16.8 % — ABNORMAL HIGH (ref 11.5–15.5)

## 2012-10-20 LAB — BASIC METABOLIC PANEL
CO2: 27 mEq/L (ref 19–32)
Glucose, Bld: 171 mg/dL — ABNORMAL HIGH (ref 70–99)
Potassium: 3.4 mEq/L — ABNORMAL LOW (ref 3.5–5.1)
Sodium: 149 mEq/L — ABNORMAL HIGH (ref 135–145)

## 2012-10-20 LAB — MAGNESIUM: Magnesium: 1.9 mg/dL (ref 1.5–2.5)

## 2012-10-20 LAB — PHOSPHORUS: Phosphorus: 2.3 mg/dL (ref 2.3–4.6)

## 2012-10-20 LAB — GLUCOSE, CAPILLARY
Glucose-Capillary: 139 mg/dL — ABNORMAL HIGH (ref 70–99)
Glucose-Capillary: 154 mg/dL — ABNORMAL HIGH (ref 70–99)
Glucose-Capillary: 209 mg/dL — ABNORMAL HIGH (ref 70–99)
Glucose-Capillary: 66 mg/dL — ABNORMAL LOW (ref 70–99)
Glucose-Capillary: 71 mg/dL (ref 70–99)
Glucose-Capillary: 89 mg/dL (ref 70–99)
Glucose-Capillary: 92 mg/dL (ref 70–99)

## 2012-10-20 LAB — PROCALCITONIN: Procalcitonin: 0.21 ng/mL

## 2012-10-20 MED ORDER — DEXTROSE 50 % IV SOLN
INTRAVENOUS | Status: AC
  Administered 2012-10-20 – 2012-10-21 (×2): 25 mL
  Filled 2012-10-20: qty 50

## 2012-10-20 MED ORDER — POTASSIUM CHLORIDE 10 MEQ/100ML IV SOLN
10.0000 meq | INTRAVENOUS | Status: AC
  Administered 2012-10-20 (×2): 10 meq via INTRAVENOUS
  Filled 2012-10-20 (×3): qty 100

## 2012-10-20 MED ORDER — POTASSIUM CHLORIDE 10 MEQ/100ML IV SOLN
10.0000 meq | INTRAVENOUS | Status: AC
  Administered 2012-10-20 (×2): 10 meq via INTRAVENOUS
  Filled 2012-10-20: qty 100

## 2012-10-20 MED ORDER — VALPROATE SODIUM 500 MG/5ML IV SOLN
1500.0000 mg | Freq: Every day | INTRAVENOUS | Status: DC
  Administered 2012-10-20 – 2012-10-23 (×3): 1500 mg via INTRAVENOUS
  Filled 2012-10-20 (×3): qty 15

## 2012-10-20 MED ORDER — DEXMEDETOMIDINE HCL IN NACL 400 MCG/100ML IV SOLN
0.2000 ug/kg/h | INTRAVENOUS | Status: DC
  Administered 2012-10-20: 0.5 ug/kg/h via INTRAVENOUS
  Filled 2012-10-20: qty 100

## 2012-10-20 MED ORDER — BIOTENE DRY MOUTH MT LIQD
15.0000 mL | Freq: Two times a day (BID) | OROMUCOSAL | Status: DC
  Administered 2012-10-23 – 2012-10-25 (×4): 15 mL via OROMUCOSAL

## 2012-10-20 MED ORDER — FUROSEMIDE 10 MG/ML IJ SOLN
40.0000 mg | Freq: Once | INTRAMUSCULAR | Status: AC
  Administered 2012-10-20: 40 mg via INTRAVENOUS
  Filled 2012-10-20: qty 4

## 2012-10-20 MED ORDER — VALPROATE SODIUM 500 MG/5ML IV SOLN
1000.0000 mg | Freq: Every day | INTRAVENOUS | Status: DC
  Administered 2012-10-20 – 2012-10-22 (×3): 1000 mg via INTRAVENOUS
  Filled 2012-10-20 (×4): qty 10

## 2012-10-20 NOTE — Progress Notes (Signed)
[id number]/Rhonda Davis, RN, BSN, CCM: CHART REVIEWED AND UPDATED.  Next chart review due on [id number]. NO DISCHARGE NEEDS PRESENT AT THIS TIME. CASE MANAGEMENT [phone] 

## 2012-10-20 NOTE — Progress Notes (Signed)
PT Cancellation Note  Patient Details Name: Margaret Cooper MRN: [id number] DOB: [birth year]   Cancelled Treatment:    Reason Eval/Treat Not Completed: Patient not medically ready (up in chair, some diarrhea, RN wants PT to start 12/17)   Rada Hay 10/20/2012, 3:03 PM [phone]

## 2012-10-20 NOTE — Evaluation (Signed)
Clinical/Bedside Swallow Evaluation Patient Details  Name: Margaret Cooper MRN: [id number] Date of Birth: [birth year]  Today's Date: 10/20/2012 Time: 1000-1029 SLP Time Calculation (min): 29 min  Past Medical History:  Past Medical History  Diagnosis Date  . Seizures   . Down's syndrome    Past Surgical History: History reviewed. No pertinent past surgical history. HPI:  26 yo Downs syndrome , group home pt, seen at MD office 1 week ago and tx with zithro for URI. Presented to [facility] ED 12-11 in acute resp distress, renal failure, shock.    Assessment / Plan / Recommendation Clinical Impression  Patient exhibits s/s of aspiration with H20, and questionable aspiration with puree, including cough after swallow and multiple swallows on one small bite of puree.  Patient is unable to phonate, has congested cough at baseline, and is unable to clear pharyngeal secreations.  RN and SLP attempt to suction pharynx, with limited success, due to patient being combative.  Oral care was attempted by SLP, again with limited results.  Patient did attempt oral care by herself.  Bloody dried secretions noted on front upper teeth.  Pharyngeal secretions were also noted to be bloody.  Patient is not safe for p.o. intake at this time.    Aspiration Risk  Moderate    Diet Recommendation NPO;Alternative means - temporary   Medication Administration: Via alternative means    Other  Recommendations Recommended Consults: MBS (When patient is managing secretions.)   Follow Up Recommendations       Frequency and Duration min 2x/week  2 weeks   Pertinent Vitals/Pain N/A    SLP Swallow Goals Goal #3: Patient will cough and clear pharyngeal secretions with moderate cues. Goal #4: Complete MBS when managing secretions and tolerating bites of puree.Tenna Cooper Study Prior Functional Status       General HPI: 26 yo Downs syndrome , group home pt, seen at MD office 1 week ago and tx with zithro for URI.  Presented to [facility] ED 12-11 in acute resp distress, renal failure, shock.  Type of Study: Bedside swallow evaluation Diet Prior to this Study: NPO Respiratory Status: Supplemental O2 delivered via (comment) History of Recent Intubation: Yes Length of Intubations (days): 5 days (Just extubated this am.) Date extubated: 10/20/12 (RN reports oral meds have been ordered.) Behavior/Cognition: Alert;Confused;Impulsive;Uncooperative;Distractible;Requires cueing;Doesn'Cooper follow directions;Decreased sustained attention Oral Cavity - Dentition: Adequate natural dentition Self-Feeding Abilities: Able to feed self;Needs assist;Needs set up Patient Positioning: Upright in bed Baseline Vocal Quality: Aphonic;Low vocal intensity Volitional Cough: Weak;Congested Volitional Swallow: Unable to elicit    Oral/Motor/Sensory Function Overall Oral Motor/Sensory Function:  (Unable to assess.  Patient not f/c's.)   Ice Chips Ice chips:  (Patient refused.)   Thin Liquid Thin Liquid: Impaired Presentation: Cup Oral Phase Impairments: Reduced labial seal;Impaired anterior to posterior transit Pharyngeal  Phase Impairments: Suspected delayed Swallow;Cough - Immediate;Change in Vital Signs (Sats. dropped to 87%.)    Nectar Thick Nectar Thick Liquid: Not tested   Honey Thick Honey Thick Liquid: Not tested   Puree Puree: Impaired Presentation: Spoon Pharyngeal Phase Impairments: Multiple swallows;Cough - Delayed;Change in Vital Signs   Solid   GO    Solid: Not tested       Margaret Cooper 10/20/2012,10:53 AM

## 2012-10-20 NOTE — Procedures (Signed)
Extubation Procedure Note  Patient Details:   Name: Margaret Cooper DOB: 30-Apr-1986 MRN: [id number]   Airway Documentation:     Evaluation  O2 sats: 94 Complications:none Patient did tolerate procedure well. Bilateral Breath Sounds: Diminished Suctioning: Airway Able TO SPEAK  Margaret Cooper 10/20/2012, 8:57 AM

## 2012-10-20 NOTE — Progress Notes (Signed)
PULMONARY  / CRITICAL CARE MEDICINE  Name: Margaret Cooper MRN: [id number] DOB: May 28, 1986    LOS: 5  REFERRING MD :  EDP  CHIEF COMPLAINT:  SOB  BRIEF PATIENT DESCRIPTION: 26 yo Downs syndrome , group home pt, seen at MD office 1 week ago and tx with zithro for URI. Presented to [facility] ED 12-11 in acute resp distress, renal failure, shock. PCCM asked to admit. Influenza A positive.  LINES / TUBES: 12-11 OTT>>> 12-11 rt rad aline>>> 10/17/12 12-11 lt ij  CVL>>>  CULTURES: 12/11 MRSA PCR - neg 12-11 bc x 2>>> neg 12-11 flu panel +h1n1>>Influenza A positive. 12-11 bal>>> neg 12/11 Urine>>>NTD 12/11 Urine strep -neg 12/11 Urine leg - >> ?sent ............... 12/15 BAL>>  12/15 Blood >> ng 12/15 Urine 12/15  Lab 10/20/12 0400 10/19/12 0440 10/15/12 2220 10/15/12 1030  PROCALCITON 0.21 0.34 1.58 4.08       ANTIBIOTICS: 12-11 vanco>>12/15 12-11 zoysn>>12-11 12-11 fortaz>>12/15 12-11 levaquin>> 12-11 tamiflu>> 12/16    SIGNIFICANT EVENTS:  12-11 difficut intubation 12/11 bronch DAH 12/14 - agitated on WUA. 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   LEVEL OF CARE:  ICU PRIMARY SERVICE:  PCCM CONSULTANTS:   CODE STATUS FULL DIET:  npo DVT Px:  pas GI Px:  ppi  VITAL SIGNS: Temp:  [93.6 F (34.2 C)-100.6 F (38.1 C)] 97.7 F (36.5 C) (12/16 0600) Pulse Rate:  [80-121] 80  (12/16 0600) Resp:  [0-33] 32  (12/16 0600) BP: (90-129)/(52-75) 99/61 mmHg (12/16 0600) SpO2:  [90 %-99 %] 98 % (12/16 0745) FiO2 (%):  [40 %-60 %] 40 % (12/16 0745) Weight:  [85.3 kg (188 lb 0.8 oz)] 85.3 kg (188 lb 0.8 oz) (12/16 0300) HEMODYNAMICS:   VENTILATOR SETTINGS: Vent Mode:  [-] CPAP FiO2 (%):  [40 %-60 %] 40 % Set Rate:  [24 bmp-32 bmp] 24 bmp Vt Set:  [360 mL] 360 mL PEEP:  [5 cmH20-10 cmH20] 5 cmH20 Plateau Pressure:  [18 cmH20-26 cmH20] 18 cmH20 INTAKE / OUTPUT: Intake/Output     12/15 0701 - 12/16 0700 12/16 0701 - 12/17 0700   I.V. (mL/kg) 340.9 (4)    Other 180    NG/GT 1600    IV Piggyback 656    Total Intake(mL/kg) 2776.9 (32.6)    Urine (mL/kg/hr) 3450 (1.7)    Stool 4    Total Output 3454    Net -677.1          PHYSICAL EXAMINATION: General:  Obese downs syndrome  Neuro:  RASS 0 on WUA. No CAM-ICU assessment due to Down Syndrome HEENT:  Severe overbite Cardiovascular:  hsr rrr Lungs:  Coarse bilateral Abdomen:  obese Musculoskeletal:  intact Skin:  warm  LABS: Cbc  Lab 10/20/12 0400 10/19/12 0440 10/18/12 0400  WBC 8.9 -- --  HGB 9.7* 9.7* 9.9*  HCT 29.8* 30.0* 30.6*  PLT 61* 57* 58*   Chemistry  Lab 10/20/12 0400 10/19/12 0440 10/18/12 0400  NA 149* 152* 148*  K 3.4* 3.3* 3.3*  CL 115* 121* 116*  CO2 27 26 25   BUN 20 21 29*  CREATININE 1.32* 1.40* 1.71*  CALCIUM 8.2* 7.5* 7.0*  MG 1.9 2.1 2.2  PHOS 2.3 1.9* 2.3  GLUCOSE 171* 127* 134*   Liver fxn  Lab 10/15/12 1030  AST 132*  ALT 48*  ALKPHOS 39  BILITOT 0.2*  PROT 7.1  ALBUMIN 2.2*   coags  Lab 10/16/12 0520 10/15/12 1125  APTT 32 34  INR 1.04 0.98   Sepsis markers  Lab 10/20/12 0400 10/19/12 1022 10/19/12 0440 10/15/12 2220 10/15/12 1124  LATICACIDVEN -- 1.2 -- -- 2.5*  PROCALCITON 0.21 -- 0.34 1.58 --   Cardiac markers No results found for this basename: CKTOTAL:3,CKMB:3,TROPONINI:3 in the last 168 hours BNP  Lab 10/20/12 0400 10/19/12 0440  PROBNP 510.3* 832.7*   ABG  Lab 10/18/12 0423 10/17/12 0508 10/16/12 0951  PHART 7.422 7.371 7.391  PCO2ART 35.5 38.1 29.1*  PO2ART 81.8 62.0* 92.2  HCO3 22.7 21.5 17.3*  TCO2 20.8 20.1 16.3   CBG trend  Lab 10/20/12 0410 10/20/12 0011 10/19/12 2000 10/19/12 1550 10/19/12 1141  GLUCAP 147* 209* 139* 178* 111*   IMAGING: Dg Chest Port 1 View  10/20/2012  *RADIOLOGY REPORT*  Clinical Data: Pleural effusions.  Intubated patient.  PORTABLE CHEST - 1 VIEW  Comparison: Chest 10/19/2012 and 10/18/2012.   Findings: The patient's left IJ catheter has backed out with the tip projecting at the junction of the left brachiocephalic vein and superior vena cava. Support apparatus is otherwise unchanged. Bilateral airspace disease and effusions persists with some increase in airspace disease in the right base.  Heart size is normal.  No pneumothorax.  IMPRESSION:  1.  Right IJ catheter has backed out with the tip now projecting over the junction of the left brachiocephalic vein and superior vena cava. 2.  Bilateral airspace disease shows some worsening in the right base.   Original Report Authenticated By: Holley Dexter, M.D.    Dg Chest Port 1 View  10/19/2012  *RADIOLOGY REPORT*  Clinical Data: Check endotracheal tube  PORTABLE CHEST - 1 VIEW  Comparison: Chest radiograph 10/18/2012  Findings: Endotracheal tube, NG tube, and left central venous line are unchanged.  Stable heart silhouette.  There is a bibasilar air space disease which is diffuse in nature and not improved compared to prior.  Small bilateral pleural effusions.  No pneumothorax.  IMPRESSION: 1.  Stable support apparatus. 2.   No significant change. 3.  Bibasilar air space disease and effusions.   Original Report Authenticated By: Genevive Bi, M.D.    ECG:  DIAGNOSES: Active Problems:  ARDS (adult respiratory distress syndrome)  Septic shock(785.52)  Acute respiratory failure with hypoxia  Pulmonary alveolar hemorrhage  ASSESSMENT / PLAN:  PULMONARY  ASSESSMENT: VDRF secondary to ARDS due to influenza A. Very difficult airway. Hempotysis = DAH due to influenza A related ([employer] panel 10/15/12 negative) Strep urin Ag - negative - known difficult airway    PLAN:   - SBT  tolerated well  -proceed with extubation   Continue tamiflu last day. - ct diuresis    CARDIOVASCULAR  ASSESSMENT:  Shock(presumed septic with elevated procal). Sp EGDT complete 10/15/12. ECHO ef 60%   PLAN:  - map goal > 65  RENAL  Lab  10/20/12 0400 10/19/12 0440 10/18/12 0400 10/17/12 0445 10/16/12 0520  NA 149* 152* 148* 143 141  K 3.4* 3.3* -- -- --  CL 115* 121* 116* 113* 109  CO2 27 26 25 22  18*  GLUCOSE 171* 127* 134* 164* 126*  BUN 20 21 29* 36* 32*  CREATININE 1.32* 1.40* 1.71* 1.90* 2.25*  CALCIUM 8.2* 7.5* 7.0* 6.8* 6.7*  MG 1.9 2.1 2.2 2.4 2.0  PHOS 2.3 1.9* 2.3 2.2* 2.8     ASSESSMENT:  ARF due to sepsis. Autoimmne neg - pulm/renal syndrome ruled out.Negative renal US  - improving creat and hypophos and hypernatremia    ARF  PLAN:   - dc saline kvo - Repleted lytes -lasix 40 to keep dry   GASTROINTESTINAL  ASSESSMENT:   NPO PLAN:   PPI Dc TFs & evaluate swallow  HEMATOLOGIC  Lab 10/20/12 0400 10/19/12 0440 10/18/12 0400  HGB 9.7* 9.7* 9.9*  HCT 29.8* 30.0* 30.6*  WBC 8.9 6.2 8.2  PLT 61* 57* 58*    ASSESSMENT:   Anemia of critical illness Thrombocytopenia - ? Due to sepsis   PLAN:  - Daily CBC. - SCDs. - no heparin  INFECTIOUS  ASSESSMENT:   Presumed CAP v HCAP PNA (from group) due to Flu A and DAH due to flu  On 10/19/12 - new fever  PLAN:   - DC vanc and ceftaz - Cointinue levo - Tamiflu. - F/U on culture repeat 10/19/12.  ENDOCRINE  ASSESSMENT:  At risk hyerpglycemia PLAN:   SSI.  TSH. nml 12/11  NEUROLOGIC  ASSESSMENT: Encephalopathic, dyschrony, most likely from renal failure, acidosis, sepsis   PLAN:   - Continue valproic acid and seroquel.  - dc Fent drip. - dc prcedex   CLINICAL SUMMARY:  26 yo downs syndrome, tx for pna, flu, renal failure, sepsis, ARDS   CRITICAL CARE: The patient is critically ill with multiple organ systems failure and requires high complexity decision making for assessment and support, frequent evaluation and titration of therapies, application of advanced monitoring technologies and extensive interpretation of multiple databases. Critical Care Time devoted to patient care services described in this note is 35 minutes.     Cyril Mourning MD. Tonny Bollman. [facility] Pager [phone] If no response call [phone]    10/20/2012 8:24 AM

## 2012-10-21 ENCOUNTER — Inpatient Hospital Stay (HOSPITAL_COMMUNITY): Payer: Medicaid Other

## 2012-10-21 DIAGNOSIS — J111 Influenza due to unidentified influenza virus with other respiratory manifestations: Secondary | ICD-10-CM

## 2012-10-21 LAB — BASIC METABOLIC PANEL
CO2: 32 mEq/L (ref 19–32)
Calcium: 8.3 mg/dL — ABNORMAL LOW (ref 8.4–10.5)
Creatinine, Ser: 1.32 mg/dL — ABNORMAL HIGH (ref 0.50–1.10)
GFR calc Af Amer: 63 mL/min — ABNORMAL LOW (ref >90)
GFR calc non Af Amer: 54 mL/min — ABNORMAL LOW (ref >90)
Sodium: 149 mEq/L — ABNORMAL HIGH (ref 135–145)

## 2012-10-21 LAB — CULTURE, BLOOD (ROUTINE X 2)

## 2012-10-21 LAB — GLUCOSE, CAPILLARY
Glucose-Capillary: 106 mg/dL — ABNORMAL HIGH (ref 70–99)
Glucose-Capillary: 108 mg/dL — ABNORMAL HIGH (ref 70–99)
Glucose-Capillary: 68 mg/dL — ABNORMAL LOW (ref 70–99)

## 2012-10-21 LAB — CULTURE, BAL-QUANTITATIVE W GRAM STAIN
Colony Count: NO GROWTH
Culture: NO GROWTH

## 2012-10-21 LAB — CBC
Platelets: 76 10*3/uL — ABNORMAL LOW (ref 150–400)
RBC: 3.35 MIL/uL — ABNORMAL LOW (ref 3.87–5.11)
RDW: 16.9 % — ABNORMAL HIGH (ref 11.5–15.5)
WBC: 12.8 10*3/uL — ABNORMAL HIGH (ref 4.0–10.5)

## 2012-10-21 LAB — ANTIPHOSPHOLIPID SYNDROME EVAL, BLD
Anticardiolipin IgA: 7 APL U/mL — ABNORMAL LOW (ref <22)
Anticardiolipin IgG: 45 GPL U/mL — ABNORMAL HIGH (ref <23)
Lupus Anticoagulant: NOT DETECTED
Phosphatydalserine, IgA: 5 U/mL (ref <20)
Phosphatydalserine, IgG: 5 U/mL (ref <16)
Phosphatydalserine, IgM: 0 U/mL (ref <22)

## 2012-10-21 LAB — PROCALCITONIN: Procalcitonin: 0.16 ng/mL

## 2012-10-21 MED ORDER — LORAZEPAM 2 MG/ML IJ SOLN
0.5000 mg | Freq: Three times a day (TID) | INTRAMUSCULAR | Status: DC | PRN
  Administered 2012-10-21 – 2012-10-22 (×4): 1 mg via INTRAVENOUS
  Filled 2012-10-21 (×4): qty 1

## 2012-10-21 MED ORDER — HALOPERIDOL LACTATE 5 MG/ML IJ SOLN
1.0000 mg | Freq: Four times a day (QID) | INTRAMUSCULAR | Status: DC | PRN
  Administered 2012-10-21 – 2012-10-22 (×2): 4 mg via INTRAVENOUS
  Filled 2012-10-21 (×4): qty 1

## 2012-10-21 MED ORDER — VITAMINS A & D EX OINT
TOPICAL_OINTMENT | CUTANEOUS | Status: AC
  Administered 2012-10-21: 2
  Filled 2012-10-21: qty 5

## 2012-10-21 MED ORDER — DEXTROSE 50 % IV SOLN
INTRAVENOUS | Status: AC
  Administered 2012-10-21: 25 mL
  Filled 2012-10-21: qty 50

## 2012-10-21 MED ORDER — FUROSEMIDE 10 MG/ML IJ SOLN
40.0000 mg | Freq: Once | INTRAMUSCULAR | Status: AC
  Administered 2012-10-21: 40 mg via INTRAVENOUS
  Filled 2012-10-21: qty 4

## 2012-10-21 MED ORDER — PANTOPRAZOLE SODIUM 40 MG IV SOLR
40.0000 mg | INTRAVENOUS | Status: DC
  Administered 2012-10-21 – 2012-10-23 (×2): 40 mg via INTRAVENOUS
  Filled 2012-10-21 (×2): qty 40

## 2012-10-21 MED ORDER — HALOPERIDOL LACTATE 5 MG/ML IJ SOLN
5.0000 mg | Freq: Once | INTRAMUSCULAR | Status: AC
  Administered 2012-10-21: 5 mg via INTRAVENOUS
  Filled 2012-10-21: qty 1

## 2012-10-21 NOTE — Progress Notes (Signed)
PHYSICAL THERAPY Discontinue PT per RN who reports that pt is not cooperative at times and is fairly mobile. Please reorder PT if indicated. Blanchard Kelch PT [phone]

## 2012-10-21 NOTE — Progress Notes (Signed)
Nsg note: Pt uncooperative with peri care; she refused to have complete peri care each time she we had to clean her up. She holds thighs closed and clenches and buttocks. I explained the benefits and risks of good and bad peri care- pt continued to be uncooperative.

## 2012-10-21 NOTE — Progress Notes (Signed)
ANTIBIOTIC CONSULT NOTE  Pharmacy Consult for Levaquin Indication: Sepsis, Pneumonia  No Known Allergies  Patient Measurements: Height: 5\' 6"  (167.6 cm) Weight: 188 lb 0.8 oz (85.3 kg) IBW/kg (Calculated) : 59.3   Vital Signs: Temp: 99.5 F (37.5 C) (12/17 0600) Temp src: Core (Comment) (12/17 0400) BP: 113/59 mmHg (12/17 0848) Pulse Rate: 119  (12/17 0600)  Intake/Output from previous day: 12/16 0701 - 12/17 0700 In: 1097 [I.V.:2; NG/GT:75; IV Piggyback:670] Out: 2731 [Urine:2730; Stool:1]  Labs:  [facility] 10/21/12 0400 10/20/12 0400 10/19/12 0440  WBC 12.8* 8.9 6.2  HGB 10.0* 9.7* 9.7*  PLT 76* 61* 57*  LABCREA -- -- --  CREATININE 1.32* 1.32* 1.40*   Estimated Creatinine Clearance: 69.8 ml/min (by C-G formula based on Cr of 1.32).   Microbiology: 12/11 blood x 2: ngtd 12/11 urine: NG(f) 12/11 trach asp: rare GNR/GPR 12/11 BAL: rare GPR/GPC pairs 12/11 urine strep/legionella: neg/pending 12/11 RSV: influenza A+, B- 12/11 H1N1: DETECTED  Assessment:  26 YOF with Down's and mild MR presents with AMS, NV. Seen by PCP 3 days prior to admission, started on Zpack for URI.  Admitted 12/11 to ICU for r/o sepsis, possible PNA and positive influenza  Tamiflu completed  Day #6 Levaquin of planned 7 days  Stable Scr  Goal of Therapy:  Levaquin doses per renal function  Plan:  1) Continue Levaquin 750mg  IV q24 2) Note tomorrow should be last dose of Levaquin to complete 7 days   Hessie Knows, PharmD, BCPS Pager [phone] 10/21/2012 10:49 AM

## 2012-10-21 NOTE — Progress Notes (Signed)
Speech Language Pathology Dysphagia Treatment Patient Details Name: Margaret Cooper MRN: [id number] DOB: 1985/12/11 Today's Date: 10/21/2012 Time: 4098-1191 SLP Time Calculation (min): 11 min  Assessment / Plan / Recommendation Clinical Impression  F/u after yesterday's clinical swallow eval.  Pt sitting in chair with nursing present, agitated, requesting soda.  Pt is impulsive; unwilling to allow clinician to assist and not responding to cues.  Consumed large, successive boluses of cola (refusing other liquids per RN) with swift swallow response, appropriate expiratory phase post-swallow, but intermittent belching followed by episode of vomiting and coughing.  Pt continued to request POs despite emesis covering gown.  Assessment discontinued.  Recommend continued NPO for now - behavioral issues and impulsivity are greatest barrier.  SLP to f/u tomorrow.  Given no other comorbidities that would cause dysphagia, would anticipate improvements and resumption of normal diet shortly.        Diet Recommendation  Continue with Current Diet: NPO    SLP Plan Continue with current plan of care   Pertinent Vitals/Pain No   Swallowing Goals  SLP Swallowing Goals Patient will consume recommended diet without observed clinical signs of aspiration with: Modified independent assistance Swallow Study Goal #3 - Progress: Not met  General Temperature Spikes Noted: No Respiratory Status: Supplemental O2 delivered via (comment) Behavior/Cognition: Alert;Confused;Impulsive;Requires cueing Oral Cavity - Dentition: Adequate natural dentition Patient Positioning: Upright in chair  Oral Cavity - Oral Hygiene     Dysphagia Treatment Treatment focused on: Skilled observation of diet tolerance Treatment Methods/Modalities: Skilled observation;Differential diagnosis Patient observed directly with PO's: Yes Type of PO's observed: Thin liquids; refused solids Feeding: Able to feed self;Needs assist Liquids  provided via: Cup Pharyngeal Phase Signs & Symptoms: Delayed throat clear;Other (comment) (vomiting) Amount of cueing: Moderate   [place]ope Emmitt L. Samson Frederic, [place] CCC/SLP Pager [phone]      Blenda Mounts Laurice 10/21/2012, 12:15 PM

## 2012-10-21 NOTE — Progress Notes (Addendum)
Nsg note: late entry- Pt has progressively become more agitated and uncooperative through the night. Pt has been throwing items at staff and refusing treatments such as IS, SCDs, BP. Pt would not take seroquel crushed in applesauce, she spit it out. She has also been spitting on floor and pushing staff away. MD made aware and orders received. Haldol 5mg  IV given per order, pt responded well and is now resting. Per report- pt has had swelling and wart-like bumps in perineal area from front to back. Pt c/o pain and tenderness in perineal area. Completed foley and peri care twice this shift and explained to pt the necessity of personal hygiene. I had her complete some of the cleaning herself and she was able to clean appropriately.

## 2012-10-21 NOTE — Progress Notes (Signed)
eLink Physician-Brief Progress Note Patient Name: Margaret Cooper DOB: 1986/04/23 MRN: [id number]  Date of Service  10/21/2012   HPI/Events of Note  Call from bedside nurse reporting agitation in patient normally on seroquel but unable to take as no bedside swallowing evaluation post-extubation and patient spit out the applesauce that had the seroquel crushed.  Nurse reports patient as uncooperative, throwing items at staff and unable to sleep.   eICU Interventions  Plan: QTc is less than 500 Haldol 5 mg IV times one dose now for agitation   Intervention Category Minor Interventions: Agitation / anxiety - evaluation and management  Tyrice Hewitt 10/21/2012, 1:08 AM

## 2012-10-21 NOTE — Progress Notes (Signed)
NUTRITION FOLLOW UP  Intervention:   1.  Modify diet; per MD/SLP discretion.  Nutrition Dx:   Inadequate oral intake, ongoing  Monitor:   1.  Food/Beverage; diet advancement with tolerance. 2.  Head/neck; no s/s aspiration  Assessment:   Pt with Downs syndrome admitted from group home with acute respiratory distress, renal failure, and shock.  Pt was intubated and received Oxepa while intubated.  Pt has since been extubated.  Pt failed bedside swallow eval.  Awaiting assessment by speech prior to starting diet. Per RN, pt is hungry and requesting something to eat.  Height: Ht Readings from Last 1 Encounters:  10/15/12 5\' 6"  (1.676 m)    Weight Status:   Wt Readings from Last 1 Encounters:  10/20/12 188 lb 0.8 oz (85.3 kg)    Re-estimated needs:  Kcal: 1700-1870 Protein: 56-68g Fluid: ~1.8 L/day   Skin: intact  Diet Order: NPO   Intake/Output Summary (Last 24 hours) at 10/21/12 1138 Last data filed at 10/21/12 1000  Gross per 24 hour  Intake   1020 ml  Output   4230 ml  Net  -3210 ml    Last BM: 12/17   Labs:   Lab 10/21/12 0400 10/20/12 0400 10/19/12 0440 10/18/12 0400  NA 149* 149* 152* --  K 3.9 3.4* 3.3* --  CL 113* 115* 121* --  CO2 32 27 26 --  BUN 18 20 21  --  CREATININE 1.32* 1.32* 1.40* --  CALCIUM 8.3* 8.2* 7.5* --  MG -- 1.9 2.1 2.2  PHOS -- 2.3 1.9* 2.3  GLUCOSE 74 171* 127* --   CBG (last 3)   Basename 10/21/12 0738 10/21/12 0328 10/21/12 0018  GLUCAP 76 80 120*    Scheduled Meds:   . antiseptic oral rinse  15 mL Mouth Rinse q12n4p  . chlorhexidine  15 mL Mouth Rinse BID  . insulin aspart  2-6 Units Subcutaneous Q4H  . levofloxacin (LEVAQUIN) IV  750 mg Intravenous Q24H  . QUEtiapine  200 mg Per Tube BID  . valproate sodium  1,000 mg Intravenous Daily  . valproate sodium  1,500 mg Intravenous QHS   Continuous Infusions:    Loyce Dys, MS RD LDN Clinical Inpatient Dietitian Pager: [phone] Weekend/After hours pager:  [phone]

## 2012-10-21 NOTE — Progress Notes (Signed)
I was called by the nursing staff; the patient found sitting down next to the chair, she most likely fell; however she denies injury, pain; there is no sign of trauma. she did damage the central line.   Plan: remove central line, iv medications through peripheral IV. If necessary we will order either pic line or place a new central line. Continue to monitor for pain related to trauma.

## 2012-10-21 NOTE — Progress Notes (Signed)
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 Presented to [facility] ED 12-11 in acute resp distress, renal failure, shock. PCCM asked to admit. Influenza A positive.  LINES / TUBES: 12-11 OTT>>>12/16 12-11 rt rad aline>>> 10/17/12 12-11 lt ij  CVL>>>  CULTURES: 12/11 MRSA PCR - neg 12-11 bc x 2>>> neg 12-11 flu panel +h1n1>>Influenza A positive. 12-11 bal>>> neg 12/11 Urine>>>NTD 12/11 Urine strep -neg 12/11 Urine leg - >> ?sent ............... 12/15 BAL>> ng 12/15 Blood >> ng   Lab 10/21/12 0400 10/20/12 0400 10/19/12 0440 10/15/12 2220 10/15/12 1030  PROCALCITON 0.16 0.21 0.34 1.58 4.08       ANTIBIOTICS: 12-11 vanco>>12/15 12-11 zoysn>>12-11 12-11 fortaz>>12/15 12-11 levaquin>> 12-11 tamiflu>> 12/16    SIGNIFICANT EVENTS:  12-11 difficut intubation 12/11 bronch DAH 12/14 - agitated on WUA. Needed sedation gtt prior night 12/15 fever  SUBJECTIVE/OVERNIGHT/INTERVAL HX Afebrile, agitated overnight, missed seroquel due to inability to take PO Calmer with haldol   LEVEL OF CARE:  ICU PRIMARY SERVICE:  PCCM CONSULTANTS:   CODE STATUS FULL DIET:  npo DVT Px:  pas GI Px:  ppi  VITAL SIGNS: Temp:  [93.6 F (34.2 C)-100 F (37.8 C)] 99.5 F (37.5 C) (12/17 0600) Pulse Rate:  [97-126] 119  (12/17 0600) Resp:  [13-21] 16  (12/17 0600) BP: (93-116)/(42-82) 99/42 mmHg (12/17 0400) SpO2:  [86 %-98 %] 93 % (12/17 0600) HEMODYNAMICS:   VENTILATOR SETTINGS:   INTAKE / OUTPUT: Intake/Output      12/16 0701 - 12/17 0700 12/17 0701 - 12/18 0700   I.V. (mL/kg) 2 (0)    Other 350    NG/GT 75    IV Piggyback 670    Total Intake(mL/kg) 1097 (12.9)    Urine (mL/kg/hr) 2730 (1.3)    Stool 1    Total Output 2731    Net -1634          Stool Occurrence 3 x     PHYSICAL EXAMINATION: General:  Obese downs syndrome  Neuro:  Calm, non focal, does follow commands HEENT:  Severe overbite Cardiovascular:  hsr rrr Lungs:  Coarse bilateral Abdomen:  Obese, soft, non tender Musculoskeletal:  intact Skin:  warm  LABS: Cbc  Lab 10/21/12 0400 10/20/12 0400 10/19/12 0440  WBC 12.8* -- --  HGB 10.0* 9.7* 9.7*  HCT 31.9* 29.8* 30.0*  PLT 76* 61* 57*   Chemistry  Lab 10/21/12 0400 10/20/12 0400 10/19/12 0440 10/18/12 0400  NA 149* 149* 152* --  K 3.9 3.4* 3.3* --  CL 113* 115* 121* --  CO2 32 27 26 --  BUN 18 20 21  --  CREATININE 1.32* 1.32* 1.40* --  CALCIUM 8.3* 8.2* 7.5* --  MG -- 1.9 2.1 2.2  PHOS -- 2.3 1.9* 2.3  GLUCOSE 74 171* 127* --   Liver fxn  Lab 10/15/12 1030  AST 132*  ALT 48*  ALKPHOS 39  BILITOT 0.2*  PROT 7.1  ALBUMIN 2.2*   coags  Lab 10/16/12 0520 10/15/12 1125  APTT 32 34  INR 1.04 0.98   Sepsis markers  Lab 10/21/12 0400 10/20/12 0400 10/19/12 1022 10/19/12 0440 10/15/12 1124  LATICACIDVEN -- -- 1.2 -- 2.5*  PROCALCITON 0.16 0.21 -- 0.34 --  Cardiac markers No results found for this basename: CKTOTAL:3,CKMB:3,TROPONINI:3 in the last 168 hours BNP  Lab 10/20/12 0400 10/19/12 0440  PROBNP 510.3* 832.7*   ABG  Lab 10/18/12 0423 10/17/12 0508 10/16/12 0951  PHART 7.422 7.371 7.391  PCO2ART 35.5 38.1 29.1*  PO2ART 81.8 62.0* 92.2  HCO3 22.7 21.5 17.3*  TCO2 20.8 20.1 16.3   CBG trend  Lab 10/21/12 0738 10/21/12 0328 10/21/12 0018 10/20/12 2359 10/20/12 2156  GLUCAP 76 80 120* 68* 89   IMAGING: Dg Chest Port 1 View  10/21/2012  *RADIOLOGY REPORT*  Clinical Data: Pneumonia.  PORTABLE CHEST - 1 VIEW  Comparison: 10/20/2012.  Findings: The endotracheal tube and NG tubes have been removed. The left IJ catheter is stable.  The cardiac silhouette, mediastinal and hilar contours are stable.  The lungs demonstrate worsening aeration with low lung volumes and probable increase  in edema or infiltrate.  Persistent small pleural effusions.  IMPRESSION: Worsening aeration post extubation.   Original Report Authenticated By: Rudie Meyer, M.D.    Dg Chest Port 1 View  10/20/2012  *RADIOLOGY REPORT*  Clinical Data: Pleural effusions.  Intubated patient.  PORTABLE CHEST - 1 VIEW  Comparison: Chest 10/19/2012 and 10/18/2012.  Findings: The patient's left IJ catheter has backed out with the tip projecting at the junction of the left brachiocephalic vein and superior vena cava. Support apparatus is otherwise unchanged. Bilateral airspace disease and effusions persists with some increase in airspace disease in the right base.  Heart size is normal.  No pneumothorax.  IMPRESSION:  1.  Right IJ catheter has backed out with the tip now projecting over the junction of the left brachiocephalic vein and superior vena cava. 2.  Bilateral airspace disease shows some worsening in the right base.   Original Report Authenticated By: Holley Dexter, M.D.     DIAGNOSES: Active Problems:  ARDS (adult respiratory distress syndrome)  Septic shock(785.52)  Acute respiratory failure with hypoxia  Pulmonary alveolar hemorrhage  Influenza A  ASSESSMENT / PLAN:  PULMONARY  ASSESSMENT: VDRF secondary to ARDS due to influenza A.  Very difficult airway. Hempotysis = DAH due to influenza A related ([employer] panel 10/15/12 negative) Strep urin Ag - negative   PLAN:   - ct diuresis  -flutter valve , oob - hopefully she can clear secretions  CARDIOVASCULAR  ASSESSMENT:  Shock(presumed septic with elevated procal). Sp EGDT complete 10/15/12. ECHO ef 60% -resolved   RENAL  Lab 10/21/12 0400 10/20/12 0400 10/19/12 0440 10/18/12 0400 10/17/12 0445 10/16/12 0520  NA 149* 149* 152* 148* 143 --  K 3.9 3.4* -- -- -- --  CL 113* 115* 121* 116* 113* --  CO2 32 27 26 25 22  --  GLUCOSE 74 171* 127* 134* 164* --  BUN 18 20 21  29* 36* --  CREATININE 1.32* 1.32* 1.40* 1.71* 1.90* --  CALCIUM  8.3* 8.2* 7.5* 7.0* 6.8* --  MG -- 1.9 2.1 2.2 2.4 2.0  PHOS -- 2.3 1.9* 2.3 2.2* 2.8     ASSESSMENT:  ARF due to sepsis. Autoimmne neg - pulm/renal syndrome ruled out.Negative renal US - improving creat and hypophos and hypernatremia    ARF PLAN:   - Repleted lytes -lasix 40 to keep dry   GASTROINTESTINAL  ASSESSMENT:  dysphagia PLAN:   PPI repeat swallow  HEMATOLOGIC  Lab 10/21/12 0400 10/20/12 0400 10/19/12 0440  HGB 10.0* 9.7* 9.7*  HCT 31.9* 29.8* 30.0*  WBC 12.8* 8.9 6.2  PLT 76* 61* 57*  ASSESSMENT:   Anemia of critical illness Thrombocytopenia - ? Due to sepsis, improving   PLAN:  - Daily CBC. - SCDs. - no heparin  INFECTIOUS  ASSESSMENT:   Presumed CAP v HCAP PNA (from group) due to Flu A and DAH due to flu  On 10/19/12 - new fever  PLAN:   - DC vanc and ceftaz - Cointinue levaquin x 7ds total - completed Tamiflu   ENDOCRINE  ASSESSMENT:  At risk hyerpglycemia PLAN:   SSI. TSH. nml 12/11  NEUROLOGIC  ASSESSMENT:Encephalopathic, dyschrony, most likely from renal failure, acidosis, sepsis -resolved Agitation   PLAN:   - Continue valproate IV, resume seroquel. PO when able -ativan IV prn & haldol IM prn meantime    CLINICAL SUMMARY:  26 yo downs syndrome, tx for pna, flu, renal failure, sepsis, ARDS - extubated, main issues now seem to be behaviour agitation & secretions   CRITICAL CARE: The patient is critically ill with multiple organ systems failure and requires high complexity decision making for assessment and support, frequent evaluation and titration of therapies, application of advanced monitoring technologies and extensive interpretation of multiple databases. Critical Care Time devoted to patient care services described in this note is 31 minutes.    Cyril Mourning MD. Tonny Bollman. [facility] Pager [phone] If no response call [phone]    10/21/2012 8:37 AM

## 2012-10-22 LAB — CBC
Platelets: 98 10*3/uL — ABNORMAL LOW (ref 150–400)
RDW: 16.5 % — ABNORMAL HIGH (ref 11.5–15.5)
WBC: 18.4 10*3/uL — ABNORMAL HIGH (ref 4.0–10.5)

## 2012-10-22 LAB — BASIC METABOLIC PANEL
Calcium: 8.5 mg/dL (ref 8.4–10.5)
Chloride: 107 mEq/L (ref 96–112)
Creatinine, Ser: 1.71 mg/dL — ABNORMAL HIGH (ref 0.50–1.10)
GFR calc Af Amer: 46 mL/min — ABNORMAL LOW (ref >90)
GFR calc non Af Amer: 40 mL/min — ABNORMAL LOW (ref >90)

## 2012-10-22 LAB — GLUCOSE, CAPILLARY: Glucose-Capillary: 91 mg/dL (ref 70–99)

## 2012-10-22 MED ORDER — HALOPERIDOL LACTATE 5 MG/ML IJ SOLN: 5.0000 mg | Freq: Two times a day (BID) | INTRAMUSCULAR | Status: DC

## 2012-10-22 MED ORDER — VITAMINS A & D EX OINT
TOPICAL_OINTMENT | CUTANEOUS | Status: AC
  Administered 2012-10-22
  Filled 2012-10-22: qty 5

## 2012-10-22 MED ORDER — ONDANSETRON HCL 4 MG/2ML IJ SOLN: 4.0000 mg | Freq: Three times a day (TID) | INTRAMUSCULAR | Status: DC | PRN

## 2012-10-22 NOTE — Progress Notes (Signed)
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 Presented to [facility] ED 12-11 in acute resp distress, renal failure, shock. PCCM asked to admit. Influenza A positive.  LINES / TUBES: 12-11 OTT>>>12/16 12-11 rt rad aline>>> 10/17/12 12-11 lt ij  CVL>>>12/17  CULTURES: 12/11 MRSA PCR - neg 12-11 bc x 2>>> neg 12-11 flu panel +h1n1>>Influenza A positive. 12-11 bal>>> neg 12/11 Urine>>>NTD 12/11 Urine strep -neg ............... 12/15 BAL>> ng 12/15 Blood >> ng   Lab 10/21/12 0400 10/20/12 0400 10/19/12 0440 10/15/12 2220 10/15/12 1030  PROCALCITON 0.16 0.21 0.34 1.58 4.08    ANTIBIOTICS: 12-11 vanco>>12/15 12-11 zoysn>>12-11 12-11 fortaz>>12/15 12-11 levaquin>> 12-11 tamiflu>> 12/16   SIGNIFICANT EVENTS:  12-11 difficut intubation 12/11  bronch DAH 12/14 -agitated on WUA. Needed sedation gtt prior night 12/15  fever  SUBJECTIVE/OVERNIGHT/INTERVAL HX Afebrile, remains agitated overnight, missing seroquel due to inability to take PO Calmer with haldol   LEVEL OF CARE:  ICU PRIMARY SERVICE:  PCCM CONSULTANTS:   CODE STATUS FULL DIET:  npo DVT Px:  pas GI Px:  ppi  VITAL SIGNS: Temp:  [97.9 F (36.6 C)-99.9 F (37.7 C)] 98.5 F (36.9 C) (12/18 0800) Pulse Rate:  [74-116] 105  (12/18 0400) Resp:  [13-16] 14  (12/18 0400) BP: (100-121)/(52-64) 104/58 mmHg (12/18 0400) SpO2:  [90 %-100 %] 95 % (12/18 0400) Weight:  [80.6 kg (177 lb 11.1 oz)] 80.6 kg (177 lb 11.1 oz) (12/18 0400) HEMODYNAMICS:   VENTILATOR SETTINGS:   INTAKE / OUTPUT: Intake/Output      12/17 0701 - 12/18 0700 12/18 0701 - 12/19 0700   I.V. (mL/kg)     Other 210    NG/GT     IV Piggyback 275    Total Intake(mL/kg) 485 (6)    Urine (mL/kg/hr) 2000 (1)    Stool 1     Total Output 2001    Net -1516         Urine Occurrence 1 x    Stool Occurrence 4 x     PHYSICAL EXAMINATION: General:  Obese downs syndrome  Neuro:  Calm, non focal, does follow commands HEENT:  Severe overbite Cardiovascular:  hsr rrr Lungs:  Coarse bilateral Abdomen:  Obese, soft, non tender Musculoskeletal:  intact Skin:  warm  LABS: Cbc  Lab 10/22/12 0345 10/21/12 0400 10/20/12 0400  WBC 18.4* -- --  HGB 10.5* 10.0* 9.7*  HCT 33.5* 31.9* 29.8*  PLT 98* 76* 61*   Chemistry  Lab 10/22/12 0345 10/21/12 0400 10/20/12 0400 10/19/12 0440 10/18/12 0400  NA 147* 149* 149* -- --  K 4.0 3.9 3.4* -- --  CL 107 113* 115* -- --  CO2 36* 32 27 -- --  BUN 20 18 20  -- --  CREATININE 1.71* 1.32* 1.32* -- --  CALCIUM 8.5 8.3* 8.2* -- --  MG -- -- 1.9 2.1 2.2  PHOS -- -- 2.3 1.9* 2.3  GLUCOSE 70 74 171* -- --   Liver fxn  Lab 10/15/12 1030  AST 132*  ALT 48*  ALKPHOS 39  BILITOT 0.2*  PROT 7.1  ALBUMIN 2.2*   coags  Lab 10/16/12 0520 10/15/12 1125  APTT 32 34  INR 1.04 0.98   Sepsis markers  Lab  10/21/12 0400 10/20/12 0400 10/19/12 1022 10/19/12 0440 10/15/12 1124  LATICACIDVEN -- -- 1.2 -- 2.5*  PROCALCITON 0.16 0.21 -- 0.34 --   Cardiac markers No results found for this basename: CKTOTAL:3,CKMB:3,TROPONINI:3 in the last 168 hours BNP  Lab 10/20/12 0400 10/19/12 0440  PROBNP 510.3* 832.7*   ABG  Lab 10/18/12 0423 10/17/12 0508 10/16/12 0951  PHART 7.422 7.371 7.391  PCO2ART 35.5 38.1 29.1*  PO2ART 81.8 62.0* 92.2  HCO3 22.7 21.5 17.3*  TCO2 20.8 20.1 16.3   CBG trend  Lab 10/22/12 0017 10/21/12 2053 10/21/12 2002 10/21/12 1526 10/21/12 1224  GLUCAP 74 126* 68* 108* 106*   IMAGING: Dg Chest Port 1 View  10/21/2012  *RADIOLOGY REPORT*  Clinical Data: Pneumonia.  PORTABLE CHEST - 1 VIEW  Comparison: 10/20/2012.  Findings: The endotracheal tube and NG tubes have been removed. The left IJ catheter is stable.  The cardiac silhouette, mediastinal and  hilar contours are stable.  The lungs demonstrate worsening aeration with low lung volumes and probable increase in edema or infiltrate.  Persistent small pleural effusions.  IMPRESSION: Worsening aeration post extubation.   Original Report Authenticated By: Rudie Meyer, M.D.     DIAGNOSES: Active Problems:  ARDS (adult respiratory distress syndrome)  Septic shock(785.52)  Acute respiratory failure with hypoxia  Pulmonary alveolar hemorrhage  Influenza A  ASSESSMENT / PLAN:  PULMONARY  ASSESSMENT: VDRF secondary to ARDS due to influenza A.  Very difficult airway. Hempotysis = DAH due to influenza A related ([employer] panel 10/15/12 negative) Strep urine Ag - negative   PLAN:   - ct diuresis  - flutter valve , oob - hopefully she can clear secretions  CARDIOVASCULAR  ASSESSMENT:  Shock(presumed septic with elevated procal). Sp EGDT complete 10/15/12. ECHO ef 60% -resolved   RENAL  Lab 10/22/12 0345 10/21/12 0400 10/20/12 0400 10/19/12 0440 10/18/12 0400 10/17/12 0445 10/16/12 0520  NA 147* 149* 149* 152* 148* -- --  K 4.0 3.9 -- -- -- -- --  CL 107 113* 115* 121* 116* -- --  CO2 36* 32 27 26 25  -- --  GLUCOSE 70 74 171* 127* 134* -- --  BUN 20 18 20 21  29* -- --  CREATININE 1.71* 1.32* 1.32* 1.40* 1.71* -- --  CALCIUM 8.5 8.3* 8.2* 7.5* 7.0* -- --  MG -- -- 1.9 2.1 2.2 2.4 2.0  PHOS -- -- 2.3 1.9* 2.3 2.2* 2.8     ASSESSMENT:  ARF due to sepsis. Autoimmne neg - pulm/renal syndrome ruled out.Negative renal US - improving creat and hypophos and hypernatremia    ARF PLAN:   - Repleted lytes -observe off lasix   GASTROINTESTINAL  ASSESSMENT:  dysphagia PLAN:   PPI repeat swallow asessment  HEMATOLOGIC  Lab 10/22/12 0345 10/21/12 0400 10/20/12 0400  HGB 10.5* 10.0* 9.7*  HCT 33.5* 31.9* 29.8*  WBC 18.4* 12.8* 8.9  PLT 98* 76* 61*    ASSESSMENT:   Anemia of critical illness Thrombocytopenia - ? Due to sepsis, improving   PLAN:  - Daily  CBC. - SCDs. - no heparin  INFECTIOUS  ASSESSMENT:   Presumed CAP v HCAP PNA (from group) due to Flu A and DAH due to flu  On 10/19/12 - new fever  PLAN:   - DC vanc and ceftaz - Cointinue levaquin x 8ds total -last today - completed Tamiflu   ENDOCRINE  ASSESSMENT:  At risk hyerpglycemia PLAN:   SSI. TSH. nml 12/11  NEUROLOGIC  ASSESSMENT:Encephalopathic, dyschrony, most likely from renal  failure, acidosis, sepsis -resolved Agitation   PLAN:   - Continue valproate IV, resume seroquel. PO when able -ativan IV prn & haldol IM prn meantime    CLINICAL SUMMARY:  26 yo downs syndrome, tx for pna, flu, renal failure, sepsis, ARDS - extubated, main issues now seem to be behaviour agitation & swallow eval    Cyril Mourning MD. FCCP. [facility] Pager [phone] If no response call [phone]    10/22/2012 10:22 AM

## 2012-10-22 NOTE — Progress Notes (Signed)
eLink Physician-Brief Progress Note Patient Name: Margaret Cooper DOB: 03/26/1986 MRN: [id number]  Date of Service  10/22/2012   HPI/Events of Note  Patient with nausea and vomiting   eICU Interventions  Plan: zofran 4 mg IV q8 hours prn N/V   Intervention Category Minor Interventions: Routine modifications to care plan (e.g. PRN medications for pain, fever)  DETERDING,ELIZABETH 10/22/2012, 11:24 PM

## 2012-10-22 NOTE — Progress Notes (Signed)
Speech Language Pathology Dysphagia Treatment Patient Details Name: Margaret Cooper MRN: [id number] DOB: 1985-11-24 Today's Date: 10/22/2012 Time: 4098-1191 SLP Time Calculation (min): 58 min  Assessment / Plan / Recommendation Clinical Impression  Patient was somewhat more cooperative today, but remains hightly impulsive, which places her at risk for aspiration, especially with liquids, when taking consecutive large swallows.  When taking small sips from cup or straw, patient appears to tolerate thin liquids, but a delayed cough was noted after taking large gulps, which is what the patient will do without max assist, (verbal and tactile).  The patient would not chew a graham or saltine cracker (placed in mouth for a few seconds, then spat out unchewed), and refused to take a pill with ice cream, however, she did consume 2 containers of ice cream without overt s/s of aspiration.    Diet Recommendation       SLP Plan     Pertinent Vitals/Pain N/A   Swallowing Goals  SLP Swallowing Goals Patient will consume recommended diet without observed clinical signs of aspiration with: Moderate assistance Patient will utilize recommended strategies during swallow to increase swallowing safety with: Moderate assistance;Maximal cueing Swallow Study Goal #3 - Progress: Met Swallow Study Goal #4 - Progress: Not met  General Temperature Spikes Noted: No Respiratory Status: Supplemental O2 delivered via (comment) (Pulling canula off, Sats dropped to 87% rapidy) Behavior/Cognition: Alert;Cooperative;Impulsive;Requires cueing;Doesn't follow directions;Decreased sustained attention Oral Cavity - Dentition: Adequate natural dentition Patient Positioning: Upright in bed  Oral Cavity - Oral Hygiene Does patient have any of the following "at risk" factors?: Oxygen therapy - cannula, mask, simple oxygen devices Brush patient's teeth BID with toothbrush (using toothpaste with fluoride): Yes Patient is HIGH  RISK - Oral Care Protocol followed (see row info): Yes Patient is mechanically ventilated, follow VAP prevention protocol for oral care: Oral care provided every 4 hours   Dysphagia Treatment Treatment focused on: Upgraded PO texture trials;Skilled observation of diet tolerance;Facilitation of oral preparatory phase;Facilitation of pharyngeal phase;Utilization of compensatory strategies Treatment Methods/Modalities: Skilled observation;Differential diagnosis Patient observed directly with PO's: Yes Type of PO's observed: Dysphagia 1 (puree);Thin liquids;Regular Feeding: Able to feed self;Needs assist (Impulsive!  Needs max assist for small bites/sips.) Liquids provided via: Teaspoon;Cup;Straw Oral Phase Signs & Symptoms: Anterior loss/spillage;Prolonged bolus formation (on right; unable to chew cracker) Pharyngeal Phase Signs & Symptoms: Delayed cough (Cough after large consecutive swallows of thin liquids.) Type of cueing: Tactile;Verbal Amount of cueing: Maximal   GO     [name] 10/22/2012, 2:40 PM

## 2012-10-23 ENCOUNTER — Inpatient Hospital Stay (HOSPITAL_COMMUNITY): Payer: Medicaid Other

## 2012-10-23 LAB — BASIC METABOLIC PANEL
BUN: 15 mg/dL (ref 6–23)
Chloride: 107 mEq/L (ref 96–112)
GFR calc Af Amer: 53 mL/min — ABNORMAL LOW (ref >90)
Glucose, Bld: 100 mg/dL — ABNORMAL HIGH (ref 70–99)
Potassium: 3.6 mEq/L (ref 3.5–5.1)

## 2012-10-23 LAB — BLOOD GAS, ARTERIAL
Bicarbonate: 34.5 mEq/L — ABNORMAL HIGH (ref 20.0–24.0)
TCO2: 31.9 mmol/L (ref 0–100)
pCO2 arterial: 52.8 mmHg — ABNORMAL HIGH (ref 35.0–45.0)
pH, Arterial: 7.431 (ref 7.350–7.450)

## 2012-10-23 LAB — CBC
HCT: 30.8 % — ABNORMAL LOW (ref 36.0–46.0)
Hemoglobin: 9.9 g/dL — ABNORMAL LOW (ref 12.0–15.0)
MCH: 30.7 pg (ref 26.0–34.0)
MCHC: 32.1 g/dL (ref 30.0–36.0)

## 2012-10-23 LAB — GLUCOSE, CAPILLARY
Glucose-Capillary: 73 mg/dL (ref 70–99)
Glucose-Capillary: 128 mg/dL — ABNORMAL HIGH (ref 70–99)

## 2012-10-23 MED ORDER — AMPHETAMINE-DEXTROAMPHET ER 15 MG PO CP24: 15.0000 mg | ORAL_CAPSULE | Freq: Every morning | ORAL | Status: DC

## 2012-10-23 MED ORDER — VALPROIC ACID 250 MG/5ML PO SYRP
1000.0000 mg | ORAL_SOLUTION | Freq: Every day | ORAL | Status: DC
  Administered 2012-10-23 – 2012-10-25 (×3): 1000 mg via ORAL
  Filled 2012-10-23 (×3): qty 20

## 2012-10-23 MED ORDER — QUETIAPINE FUMARATE ER 400 MG PO TB24
400.0000 mg | ORAL_TABLET | Freq: Every day | ORAL | Status: DC
  Administered 2012-10-23 – 2012-10-24 (×2): 400 mg via ORAL
  Filled 2012-10-23 (×3): qty 1

## 2012-10-23 MED ORDER — VALPROIC ACID 250 MG/5ML PO SYRP
1500.0000 mg | ORAL_SOLUTION | Freq: Every day | ORAL | Status: DC
  Administered 2012-10-23 – 2012-10-24 (×2): 1500 mg via ORAL
  Filled 2012-10-23 (×4): qty 30

## 2012-10-23 MED ORDER — PANTOPRAZOLE SODIUM 40 MG PO TBEC
40.0000 mg | DELAYED_RELEASE_TABLET | Freq: Every day | ORAL | Status: DC
  Administered 2012-10-23 – 2012-10-25 (×3): 40 mg via ORAL
  Filled 2012-10-23 (×3): qty 1

## 2012-10-23 MED ORDER — ATOMOXETINE HCL 40 MG PO CAPS
40.0000 mg | ORAL_CAPSULE | Freq: Two times a day (BID) | ORAL | Status: DC
  Administered 2012-10-23 – 2012-10-25 (×5): 40 mg via ORAL
  Filled 2012-10-23 (×6): qty 1

## 2012-10-23 MED ORDER — AMPHETAMINE-DEXTROAMPHET ER 15 MG PO CP24
15.0000 mg | ORAL_CAPSULE | Freq: Every morning | ORAL | Status: DC
  Filled 2012-10-23: qty 1

## 2012-10-23 MED ORDER — AMPHETAMINE-DEXTROAMPHET ER 10 MG PO CP24
10.0000 mg | ORAL_CAPSULE | Freq: Every day | ORAL | Status: DC
  Administered 2012-10-23 – 2012-10-25 (×3): 10 mg via ORAL
  Filled 2012-10-23 (×3): qty 1

## 2012-10-23 MED ORDER — DM-GUAIFENESIN ER 30-600 MG PO TB12
1.0000 | ORAL_TABLET | Freq: Two times a day (BID) | ORAL | Status: DC
  Administered 2012-10-23 – 2012-10-25 (×5): 1 via ORAL
  Filled 2012-10-23 (×6): qty 1

## 2012-10-23 MED ORDER — AMPHETAMINE-DEXTROAMPHET ER 5 MG PO CP24: 15.0000 mg | ORAL_CAPSULE | Freq: Every day | ORAL | Status: DC

## 2012-10-23 MED ORDER — FUROSEMIDE 10 MG/ML IJ SOLN
20.0000 mg | Freq: Once | INTRAMUSCULAR | Status: AC
  Administered 2012-10-23: 20 mg via INTRAVENOUS

## 2012-10-23 MED ORDER — HALOPERIDOL LACTATE 5 MG/ML IJ SOLN
1.0000 mg | Freq: Four times a day (QID) | INTRAMUSCULAR | Status: DC | PRN
  Administered 2012-10-23: 2 mg via INTRAVENOUS
  Filled 2012-10-23: qty 1

## 2012-10-23 NOTE — Progress Notes (Signed)
Speech Language Pathology Dysphagia Treatment Patient Details Name: Margaret Cooper MRN: [id number] DOB: August 26, 1986 Today's Date: 10/23/2012 Time: 4098-1191 SLP Time Calculation (min): 19 min  Assessment / Plan / Recommendation Clinical Impression  Pt seen today to assess tolerance of diet:  RN reports pt remains impulsive requiring assistance to protect airway with liquids.  Tolerance of solids reported by RN to be good.  To compensate for pt behavior, RN is placing small amounts of liquids in cup and giving to pt.  Observed today with gingerale requiring moderate verbal cues to take small sips but no clinical indicators of aspiration.  SLP educated caregivers/sitters to precautions.  Pt with pooled secretions and anterior spillage of saliva, which she states is baseline.  Advised pt to try to swallow salivia.    Rec adequate oral care as pt may aspirate saliva intermittently. SLP was going to call group home to establish baseline, but home information not located in chart.  Continue dys2/thin diiet with strict precautions.  SLP to follow.     Diet Recommendation  Continue with Current Diet: Dysphagia 2 (fine chop);Thin liquid    SLP Plan Continue with current plan of care   Pertinent Vitals/Pain Afebrile, decreased, intake 45%, RN reports pt with copious secretions but not increased since prior to po initiation   Swallowing Goals  SLP Swallowing Goals Patient will consume recommended diet without observed clinical signs of aspiration with:  (progressing toward goal) Patient will utilize recommended strategies during swallow to increase swallowing safety with:  (progressing toward goal) Swallow Study Goal #4 - Progress: Discontinued (comment) (mbs not indicated currently)  General Temperature Spikes Noted: No Respiratory Status: Supplemental O2 delivered via (comment) Behavior/Cognition: Alert;Cooperative;Pleasant mood Oral Cavity - Dentition: Adequate natural dentition Patient  Positioning: Upright in bed  Oral Cavity - Oral Hygiene     Dysphagia Treatment Treatment focused on: Skilled observation of diet tolerance Treatment Methods/Modalities: Skilled observation Patient observed directly with PO's: Yes Type of PO's observed: Thin liquids Feeding: Needs assist;Able to feed self Liquids provided via: Teaspoon;Cup;Straw Oral Phase Signs & Symptoms: Anterior loss/spillage Type of cueing: Verbal Amount of cueing: Moderate (to slow rate of intake)   GO     Donavan Burnet, MS [facility] SLP [phone]

## 2012-10-23 NOTE — Progress Notes (Signed)
eLink Physician-Brief Progress Note Patient Name: Margaret Cooper DOB: [birth year] MRN: [id number]  Date of Service  10/23/2012   HPI/Events of Note  Call from nurse expressing concern regarding the paitent's resp status.  Patient is now on NRB with RR of 19 and sats of 100% .  RT also reports diffuse exp wheezing.  No current diuresis but the staff feel the patient is "wet".  Camera evaluation shows the patient sitting up in bed leaning over with NRB in place.  Less responsive.   eICU Interventions  Plan: PCXR  ABG Consider NIV - but not sure if patient will tolerate given limited coping capabilites.   Intervention Category Intermediate Interventions: Respiratory distress - evaluation and management  DETERDING,ELIZABETH 10/23/2012, 4:35 AM

## 2012-10-23 NOTE — Progress Notes (Signed)
[id number]/Rhonda Davis, RN, BSN, CCM: CHART REVIEWED AND UPDATED.  Next chart review due on 12222013. NO DISCHARGE NEEDS PRESENT AT THIS TIME. CASE MANAGEMENT [phone] 

## 2012-10-23 NOTE — Progress Notes (Signed)
[facility] PROGRESS NOTE  Margaret Cooper AVW:[id number] DOB: 12/19/1985 DOA: 10/15/2012 PCP: No primary provider on file.  Assessment/Plan: 1-Acute resp failure with hypoxia: secondary to ARDS, influenza, PNA and DAH. -now extubated and doing ok; still with some problems managing secretions -will start mucinex BID -start chest toiletry by RT -Lasix time once due to decrease BS at bases/mild crackles -wean oxygen as toelerated down to RA -Restart PO meds -Flutter valve and OOB  2- Septic shock: now resolved.  3-ARF with hypernatremia: improving; will monitor Cr trend closely. Secondary to shock/sepsis/diuresis  4-Dysphagia: tolerating dysphagia type 2 diet and thin liquids.  5-Influenza and PNA: patient afebrile; completed tamiflu course and is now one more dose to complete levaquin as well. 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  8-thrombocytopenia: avoid heparin products; continue SCD's.  DVT: SCD's.  Code Status: Full Family Communication: no family at bedside Disposition Plan: will keep on step down one more night to follow closely her breathing due to event overnight. Will start chest physiotherapy to help with secretions and transition medications to PO.   Consultants:  CCM  Procedures:  Intubation for 5 days due to resp failure (12/11-12/16  CXR (Interstitial and airspace opacities, most pronounced within the  left lower lobe, similar to slightly improved since the prior)  Antibiotics:  levaquin   HPI/Subjective: Afebrile; no CP, abdominal pain, nausea or vomiting. Patient overnight with increase work of breathing and required NR mask.  Objective: Filed Vitals:   10/23/12 0430 10/23/12 0530 10/23/12 0600 10/23/12 0800  BP:    105/53  Pulse: 111 110  103  Temp:    97.6 F (36.4 C)  TempSrc:    Axillary  Resp: 17 18  15   Height:       Weight:   80.5 kg (177 lb 7.5 oz)   SpO2: 97% 100%  96%    Intake/Output Summary (Last 24 hours) at 10/23/12 0937 Last data filed at 10/23/12 0900  Gross per 24 hour  Intake   1235 ml  Output      1 ml  Net   1234 ml   Filed Weights   10/20/12 0300 10/22/12 0400 10/23/12 0600  Weight: 85.3 kg (188 lb 0.8 oz) 80.6 kg (177 lb 11.1 oz) 80.5 kg (177 lb 7.5 oz)    Exam:   General:  Afebrile, AAOX2 (probably baseline); using NRB mask, follow commands  Cardiovascular: no rubs, no gallops, mild tachycardia  Respiratory: diffuse rhonchi, decrease BS at bases bilaterally  Abdomen: soft, NT, ND, positive BS  Neuro: AAOX2, follow commands, no focal deficit; CN intact  Data Reviewed: Basic Metabolic Panel:  Lab 10/23/12 8295 10/22/12 0345 10/21/12 0400 10/20/12 0400 10/19/12 0440 10/18/12 0400 10/17/12 0445  NA 144 147* 149* 149* 152* -- --  K 3.6 4.0 3.9 3.4* 3.3* -- --  CL 107 107 113* 115* 121* -- --  CO2 34* 36* 32 27 26 -- --  GLUCOSE 100* 70 74 171* 127* -- --  BUN 15 20 18 20 21  -- --  CREATININE 1.53* 1.71* 1.32* 1.32* 1.40* -- --  CALCIUM 8.3* 8.5 8.3* 8.2* 7.5* -- --  MG -- -- -- 1.9 2.1 2.2 2.4  PHOS -- -- -- 2.3 1.9* 2.3 2.2*   CBC:  Lab 10/23/12 0333 10/22/12 0345 10/21/12 0400 10/20/12 0400 10/19/12 0440  WBC 17.0* 18.4* 12.8* 8.9 6.2  NEUTROABS -- -- -- -- --  HGB 9.9* 10.5* 10.0* 9.7* 9.7*  HCT 30.8* 33.5* 31.9* 29.8* 30.0*  MCV 95.4 95.4 95.2 93.7 95.2  PLT 137* 98* 76* 61* 57*   BNP (last 3 results)  Basename 10/20/12 0400 10/19/12 0440  PROBNP 510.3* 832.7*   CBG:  Lab 10/23/12 0727 10/23/12 0354 10/22/12 2354 10/22/12 1942 10/22/12 1621  GLUCAP 73 92 83 97 88    Recent Results (from the past 240 hour(s))  URINE CULTURE     Status: Normal   Collection Time   10/15/12 11:22 AM      Component Value Range Status Comment   Specimen Description URINE, CATHETERIZED   Final    Special Requests NONE   Final    Culture  Setup Time 10/16/2012  01:26   Final    Colony Count NO GROWTH   Final    Culture NO GROWTH   Final    Report Status 10/17/2012 FINAL   Final   CULTURE, BLOOD (ROUTINE X 2)     Status: Normal   Collection Time   10/15/12 11:24 AM      Component Value Range Status Comment   Specimen Description BLOOD RIGHT ARM   Final    Special Requests BOTTLES DRAWN AEROBIC AND ANAEROBIC 3CC   Final    Culture  Setup Time 10/15/2012 19:57   Final    Culture NO GROWTH 5 DAYS   Final    Report Status 10/21/2012 FINAL   Final   CULTURE, BLOOD (ROUTINE X 2)     Status: Normal   Collection Time   10/15/12 11:25 AM      Component Value Range Status Comment   Specimen Description BLOOD RIGHT ARM   Final    Special Requests BOTTLES DRAWN AEROBIC AND ANAEROBIC 3CC   Final    Culture  Setup Time 10/15/2012 19:57   Final    Culture NO GROWTH 5 DAYS   Final    Report Status 10/21/2012 FINAL   Final   CULTURE, BAL-QUANTITATIVE     Status: Normal   Collection Time   10/15/12  2:21 PM      Component Value Range Status Comment   Specimen Description BRONCHIAL ALVEOLAR LAVAGE   Final    Special Requests Normal   Final    Gram Stain     Final    Value: FEW WBC PRESENT,BOTH PMN AND MONONUCLEAR     FEW SQUAMOUS EPITHELIAL CELLS PRESENT     RARE GRAM POSITIVE RODS     RARE GRAM POSITIVE COCCI IN PAIRS   Colony Count >=100,000 COLONIES/ML   Final    Culture Non-Pathogenic Oropharyngeal-type Flora Isolated.   Final    Report Status 10/18/2012 FINAL   Final   MRSA PCR SCREENING     Status: Normal   Collection Time   10/15/12  3:36 PM      Component Value Range Status Comment   MRSA by PCR NEGATIVE  NEGATIVE Final   RESPIRATORY VIRUS PANEL     Status: Abnormal   Collection Time   10/15/12  4:00 PM      Component Value Range Status Comment   Source - RVPAN BRONCHIAL ALVEOLAR LAVAGE   Corrected CORRECTED ON 12/12 AT 2148: PREVIOUSLY REPORTED AS BRONCHIAL ALVEOLAR LAVAGE   Respiratory Syncytial Virus A NOT DETECTED   Final     Respiratory Syncytial Virus B NOT DETECTED   Final    Influenza A DETECTED (*)  Final    Influenza B NOT DETECTED  Final    Parainfluenza 1 NOT DETECTED   Final    Parainfluenza 2 NOT DETECTED   Final    Parainfluenza 3 NOT DETECTED   Final    Metapneumovirus NOT DETECTED   Final    Rhinovirus NOT DETECTED   Final    Adenovirus NOT DETECTED   Final    Influenza A H1 NOT DETECTED   Final    Influenza A H3 NOT DETECTED   Final   CULTURE, RESPIRATORY     Status: Normal   Collection Time   10/15/12  4:34 PM      Component Value Range Status Comment   Specimen Description TRACHEAL ASPIRATE   Final    Special Requests Normal   Final    Gram Stain     Final    Value: RARE WBC PRESENT,BOTH PMN AND MONONUCLEAR     FEW SQUAMOUS EPITHELIAL CELLS PRESENT     RARE GRAM NEGATIVE RODS     RARE GRAM POSITIVE RODS   Culture Non-Pathogenic Oropharyngeal-type Flora Isolated.   Final    Report Status 10/18/2012 FINAL   Final   CULTURE, BLOOD (ROUTINE X 2)     Status: Normal (Preliminary result)   Collection Time   10/19/12 10:22 AM      Component Value Range Status Comment   Specimen Description BLOOD RIGHT ARM   Final    Special Requests BOTTLES DRAWN AEROBIC AND ANAEROBIC 5CC EACH   Final    Culture  Setup Time 10/19/2012 16:07   Final    Culture     Final    Value:        BLOOD CULTURE RECEIVED NO GROWTH TO DATE CULTURE WILL BE HELD FOR 5 DAYS BEFORE ISSUING A FINAL NEGATIVE REPORT   Report Status PENDING   Incomplete   CULTURE, BLOOD (ROUTINE X 2)     Status: Normal (Preliminary result)   Collection Time   10/19/12 10:29 AM      Component Value Range Status Comment   Specimen Description BLOOD RIGHT ARM   Final    Special Requests BOTTLES DRAWN AEROBIC AND ANAEROBIC [facility] EACH   Final    Culture  Setup Time 10/19/2012 16:07   Final    Culture     Final    Value:        BLOOD CULTURE RECEIVED NO GROWTH TO DATE CULTURE WILL BE HELD FOR 5 DAYS BEFORE ISSUING A FINAL NEGATIVE REPORT   Report  Status PENDING   Incomplete   URINE CULTURE     Status: Normal   Collection Time   10/19/12 10:39 AM      Component Value Range Status Comment   Specimen Description URINE, CATHETERIZED   Final    Special Requests Normal   Final    Culture  Setup Time 10/19/2012 15:53   Final    Colony Count NO GROWTH   Final    Culture NO GROWTH   Final    Report Status 10/20/2012 FINAL   Final   CULTURE, BAL-QUANTITATIVE     Status: Normal   Collection Time   10/19/12 10:59 AM      Component Value Range Status Comment   Specimen Description BRONCHIAL ALVEOLAR LAVAGE   Final    Special Requests Normal   Final    Gram Stain     Final    Value: FEW WBC PRESENT, PREDOMINANTLY PMN     NO SQUAMOUS EPITHELIAL CELLS SEEN  NO ORGANISMS SEEN   Colony Count NO GROWTH   Final    Culture NO GROWTH 2 DAYS   Final    Report Status 10/21/2012 FINAL   Final      Studies: Dg Chest Port 1 View  10/23/2012  *RADIOLOGY REPORT*  Clinical Data: Respiratory distress  PORTABLE CHEST - 1 VIEW  Comparison: 10/21/2012  Findings: Interstitial and airspace opacities, left greater than right, with mild interval improvement.  Trace effusions not excluded.  No pneumothorax.  Cardiomediastinal contours unchanged. Left IJ catheter has been removed.  No acute osseous finding.  IMPRESSION: Interstitial and airspace opacities, most pronounced within the left lower lobe, similar to slightly improved since the prior. Differential includes infection and/or edema.   Original Report Authenticated By: Jearld Lesch, M.D.     Scheduled Meds:   . amphetamine-dextroamphetamine  15 mg Oral q morning - 10a  . antiseptic oral rinse  15 mL Mouth Rinse q12n4p  . atomoxetine  40 mg Oral BID  . chlorhexidine  15 mL Mouth Rinse BID  . dextromethorphan-guaiFENesin  1 tablet Oral BID  . furosemide  20 mg Intravenous Once  . insulin aspart  2-6 Units Subcutaneous Q4H  . levofloxacin (LEVAQUIN) IV  750 mg Intravenous Q24H  . QUEtiapine  400  mg Oral QHS  . Valproic Acid  1,000 mg Oral Daily   Continuous Infusions:   Active Problems:  ARDS (adult respiratory distress syndrome)  Septic shock(785.52)  Acute respiratory failure with hypoxia  Pulmonary alveolar hemorrhage  Influenza A    Time spent: >30 minutes    Camay Pedigo  [facility] Pager [phone]. If 8PM-8AM, please contact night-coverage at [email or web address], password [facility] 10/23/2012, 9:37 AM  LOS: 8 days

## 2012-10-24 LAB — GLUCOSE, CAPILLARY
Glucose-Capillary: 65 mg/dL — ABNORMAL LOW (ref 70–99)
Glucose-Capillary: 74 mg/dL (ref 70–99)
Glucose-Capillary: 82 mg/dL (ref 70–99)
Glucose-Capillary: 84 mg/dL (ref 70–99)

## 2012-10-24 LAB — BASIC METABOLIC PANEL WITH GFR
BUN: 15 mg/dL (ref 6–23)
CO2: 33 meq/L — ABNORMAL HIGH (ref 19–32)
Calcium: 8.2 mg/dL — ABNORMAL LOW (ref 8.4–10.5)
Chloride: 100 meq/L (ref 96–112)
Creatinine, Ser: 1.71 mg/dL — ABNORMAL HIGH (ref 0.50–1.10)
GFR calc Af Amer: 46 mL/min — ABNORMAL LOW
GFR calc non Af Amer: 40 mL/min — ABNORMAL LOW
Glucose, Bld: 87 mg/dL (ref 70–99)
Potassium: 3.5 meq/L (ref 3.5–5.1)
Sodium: 140 meq/L (ref 135–145)

## 2012-10-24 LAB — CBC
HCT: 30.3 % — ABNORMAL LOW (ref 36.0–46.0)
MCHC: 31.7 g/dL (ref 30.0–36.0)
MCV: 95.3 fL (ref 78.0–100.0)
RDW: 15.6 % — ABNORMAL HIGH (ref 11.5–15.5)

## 2012-10-24 NOTE — Progress Notes (Signed)
[facility] PROGRESS NOTE  Margaret Cooper ZOX:[id number] DOB: 06-24-1986 DOA: 10/15/2012 PCP: No primary provider on file.  Assessment/Plan: 1-Acute resp failure with hypoxia: secondary to ARDS, influenza, PNA and DAH. -now extubated and doing ok; still with some problems managing secretions, but slowly improving -will continue mucinex BID -continue chest toiletry by RT, Is and flutter valve -no crackles and improve air movement. Will hold lasix for now. -wean oxygen as toelerated down to RA -PT to evaluate and help getting her OOB  2- Septic shock: now resolved.  3-ARF with hypernatremia: improving; will monitor Cr trend closely. Secondary to shock/sepsis/diuresis. Na WNL  4-Dysphagia: tolerating dysphagia type 2 diet and thin liquids. Will follow SLP rec's.  5-Influenza and PNA: patient afebrile; completed tamiflu course and also levaquin course. Will continue chest physiotherapy by RT, flutter valve and IS. Continue mucinex and slowly continue weaning oxygen to RA.  6-Hyperglycemia: continue SSI; no hx of diabetes  7-down's syndrome with behavioral disturbances: continue home regimen; appears to be stable and at baseline. Continue supportive care.  8-thrombocytopenia: avoid heparin products; continue SCD's. Platelets 139 now.  DVT: SCD's.  Code Status: Full Family Communication: no family at bedside Disposition Plan: will transfer to telemetry, still a little tachycardic. SW to contact group home to evaluate capacity of providing further care. Continue chest toiletry.  Consultants:  CCM  Procedures:  Intubation for 5 days due to resp failure (12/11-12/16  CXR (Interstitial and airspace opacities, most pronounced within the  left lower lobe, similar to slightly improved since the prior)  Antibiotics:  levaquin   HPI/Subjective: Afebrile; no CP, abdominal pain, nausea or vomiting. Improve air movement and also oxygen saturation. No major events overnight.  Tolerating current diet (Dys 2 with thin liquids)  Objective: Filed Vitals:   10/24/12 0330 10/24/12 0400 10/24/12 0800 10/24/12 0820  BP:  90/47  91/55  Pulse: 102 99 99 108  Temp:  97.4 F (36.3 C)  97.6 F (36.4 C)  TempSrc:  Axillary  Axillary  Resp:    20  Height:      Weight:  78.6 kg (173 lb 4.5 oz)    SpO2: 94% 92% 94% 100%    Intake/Output Summary (Last 24 hours) at 10/24/12 0851 Last data filed at 10/24/12 0000  Gross per 24 hour  Intake   2010 ml  Output   1256 ml  Net    754 ml   Filed Weights   10/22/12 0400 10/23/12 0600 10/24/12 0400  Weight: 80.6 kg (177 lb 11.1 oz) 80.5 kg (177 lb 7.5 oz) 78.6 kg (173 lb 4.5 oz)    Exam:   General:  Afebrile, AAOX2 (probably baseline); breathing comfortable on 2L, follow commands; eating breakfast and asking for spaghetti   Cardiovascular: no rubs, no gallops, mild tachycardia  Respiratory: diffuse rhonchi but with improved air movement.  Abdomen: soft, NT, ND, positive BS  Neuro: AAOX2, follow commands, no focal deficit; CN intact  Data Reviewed: Basic Metabolic Panel:  Lab 10/24/12 8119 10/23/12 0333 10/22/12 0345 10/21/12 0400 10/20/12 0400 10/19/12 0440 10/18/12 0400  NA 140 144 147* 149* 149* -- --  K 3.5 3.6 4.0 3.9 3.4* -- --  CL 100 107 107 113* 115* -- --  CO2 33* 34* 36* 32 27 -- --  GLUCOSE 87 100* 70 74 171* -- --  BUN 15 15 20 18 20  -- --  CREATININE 1.71* 1.53* 1.71* 1.32* 1.32* -- --  CALCIUM 8.2* 8.3* 8.5 8.3* 8.2* -- --  MG -- -- -- --  1.9 2.1 2.2  PHOS -- -- -- -- 2.3 1.9* 2.3   CBC:  Lab 10/24/12 0330 10/23/12 0333 10/22/12 0345 10/21/12 0400 10/20/12 0400  WBC 12.7* 17.0* 18.4* 12.8* 8.9  NEUTROABS -- -- -- -- --  HGB 9.6* 9.9* 10.5* 10.0* 9.7*  HCT 30.3* 30.8* 33.5* 31.9* 29.8*  MCV 95.3 95.4 95.4 95.2 93.7  PLT 139* 137* 98* 76* 61*   BNP (last 3 results)  Basename 10/20/12 0400 10/19/12 0440  PROBNP 510.3* 832.7*   CBG:  Lab 10/24/12 0735 10/24/12 0337 10/24/12 0028  10/23/12 2358 10/23/12 2031  GLUCAP 74 83 84 65* 73    Recent Results (from the past 240 hour(s))  URINE CULTURE     Status: Normal   Collection Time   10/15/12 11:22 AM      Component Value Range Status Comment   Specimen Description URINE, CATHETERIZED   Final    Special Requests NONE   Final    Culture  Setup Time 10/16/2012 01:26   Final    Colony Count NO GROWTH   Final    Culture NO GROWTH   Final    Report Status 10/17/2012 FINAL   Final   CULTURE, BLOOD (ROUTINE X 2)     Status: Normal   Collection Time   10/15/12 11:24 AM      Component Value Range Status Comment   Specimen Description BLOOD RIGHT ARM   Final    Special Requests BOTTLES DRAWN AEROBIC AND ANAEROBIC 3CC   Final    Culture  Setup Time 10/15/2012 19:57   Final    Culture NO GROWTH 5 DAYS   Final    Report Status 10/21/2012 FINAL   Final   CULTURE, BLOOD (ROUTINE X 2)     Status: Normal   Collection Time   10/15/12 11:25 AM      Component Value Range Status Comment   Specimen Description BLOOD RIGHT ARM   Final    Special Requests BOTTLES DRAWN AEROBIC AND ANAEROBIC 3CC   Final    Culture  Setup Time 10/15/2012 19:57   Final    Culture NO GROWTH 5 DAYS   Final    Report Status 10/21/2012 FINAL   Final   CULTURE, BAL-QUANTITATIVE     Status: Normal   Collection Time   10/15/12  2:21 PM      Component Value Range Status Comment   Specimen Description BRONCHIAL ALVEOLAR LAVAGE   Final    Special Requests Normal   Final    Gram Stain     Final    Value: FEW WBC PRESENT,BOTH PMN AND MONONUCLEAR     FEW SQUAMOUS EPITHELIAL CELLS PRESENT     RARE GRAM POSITIVE RODS     RARE GRAM POSITIVE COCCI IN PAIRS   Colony Count >=100,000 COLONIES/ML   Final    Culture Non-Pathogenic Oropharyngeal-type Flora Isolated.   Final    Report Status 10/18/2012 FINAL   Final   MRSA PCR SCREENING     Status: Normal   Collection Time   10/15/12  3:36 PM      Component Value Range Status Comment   MRSA by PCR NEGATIVE   NEGATIVE Final   RESPIRATORY VIRUS PANEL     Status: Abnormal   Collection Time   10/15/12  4:00 PM      Component Value Range Status Comment   Source - RVPAN BRONCHIAL ALVEOLAR LAVAGE   Corrected CORRECTED ON 12/12 AT 2148: PREVIOUSLY REPORTED AS  BRONCHIAL ALVEOLAR LAVAGE   Respiratory Syncytial Virus A NOT DETECTED   Final    Respiratory Syncytial Virus B NOT DETECTED   Final    Influenza A DETECTED (*)  Final    Influenza B NOT DETECTED   Final    Parainfluenza 1 NOT DETECTED   Final    Parainfluenza 2 NOT DETECTED   Final    Parainfluenza 3 NOT DETECTED   Final    Metapneumovirus NOT DETECTED   Final    Rhinovirus NOT DETECTED   Final    Adenovirus NOT DETECTED   Final    Influenza A H1 NOT DETECTED   Final    Influenza A H3 NOT DETECTED   Final   CULTURE, RESPIRATORY     Status: Normal   Collection Time   10/15/12  4:34 PM      Component Value Range Status Comment   Specimen Description TRACHEAL ASPIRATE   Final    Special Requests Normal   Final    Gram Stain     Final    Value: RARE WBC PRESENT,BOTH PMN AND MONONUCLEAR     FEW SQUAMOUS EPITHELIAL CELLS PRESENT     RARE GRAM NEGATIVE RODS     RARE GRAM POSITIVE RODS   Culture Non-Pathogenic Oropharyngeal-type Flora Isolated.   Final    Report Status 10/18/2012 FINAL   Final   CULTURE, BLOOD (ROUTINE X 2)     Status: Normal (Preliminary result)   Collection Time   10/19/12 10:22 AM      Component Value Range Status Comment   Specimen Description BLOOD RIGHT ARM   Final    Special Requests BOTTLES DRAWN AEROBIC AND ANAEROBIC 5CC EACH   Final    Culture  Setup Time 10/19/2012 16:07   Final    Culture     Final    Value:        BLOOD CULTURE RECEIVED NO GROWTH TO DATE CULTURE WILL BE HELD FOR 5 DAYS BEFORE ISSUING A FINAL NEGATIVE REPORT   Report Status PENDING   Incomplete   CULTURE, BLOOD (ROUTINE X 2)     Status: Normal (Preliminary result)   Collection Time   10/19/12 10:29 AM      Component Value Range Status  Comment   Specimen Description BLOOD RIGHT ARM   Final    Special Requests BOTTLES DRAWN AEROBIC AND ANAEROBIC [facility] EACH   Final    Culture  Setup Time 10/19/2012 16:07   Final    Culture     Final    Value:        BLOOD CULTURE RECEIVED NO GROWTH TO DATE CULTURE WILL BE HELD FOR 5 DAYS BEFORE ISSUING A FINAL NEGATIVE REPORT   Report Status PENDING   Incomplete   URINE CULTURE     Status: Normal   Collection Time   10/19/12 10:39 AM      Component Value Range Status Comment   Specimen Description URINE, CATHETERIZED   Final    Special Requests Normal   Final    Culture  Setup Time 10/19/2012 15:53   Final    Colony Count NO GROWTH   Final    Culture NO GROWTH   Final    Report Status 10/20/2012 FINAL   Final   CULTURE, BAL-QUANTITATIVE     Status: Normal   Collection Time   10/19/12 10:59 AM      Component Value Range Status Comment   Specimen Description BRONCHIAL ALVEOLAR LAVAGE  Final    Special Requests Normal   Final    Gram Stain     Final    Value: FEW WBC PRESENT, PREDOMINANTLY PMN     NO SQUAMOUS EPITHELIAL CELLS SEEN     NO ORGANISMS SEEN   Colony Count NO GROWTH   Final    Culture NO GROWTH 2 DAYS   Final    Report Status 10/21/2012 FINAL   Final      Studies: Dg Chest Port 1 View  10/23/2012  *RADIOLOGY REPORT*  Clinical Data: Respiratory distress  PORTABLE CHEST - 1 VIEW  Comparison: 10/21/2012  Findings: Interstitial and airspace opacities, left greater than right, with mild interval improvement.  Trace effusions not excluded.  No pneumothorax.  Cardiomediastinal contours unchanged. Left IJ catheter has been removed.  No acute osseous finding.  IMPRESSION: Interstitial and airspace opacities, most pronounced within the left lower lobe, similar to slightly improved since the prior. Differential includes infection and/or edema.   Original Report Authenticated By: Jearld Lesch, M.D.     Scheduled Meds:    . amphetamine-dextroamphetamine  10 mg Oral Daily  .  antiseptic oral rinse  15 mL Mouth Rinse q12n4p  . atomoxetine  40 mg Oral BID  . chlorhexidine  15 mL Mouth Rinse BID  . dextromethorphan-guaiFENesin  1 tablet Oral BID  . insulin aspart  2-6 Units Subcutaneous Q4H  . pantoprazole  40 mg Oral Daily  . QUEtiapine  400 mg Oral QHS  . Valproic Acid  1,000 mg Oral Daily  . Valproic Acid  1,500 mg Oral QHS    Time spent: >30 minutes   Teka Chanda  [facility] Pager [phone]. If 8PM-8AM, please contact night-coverage at [email or web address], password [facility] 10/24/2012, 8:51 AM  LOS: 9 days

## 2012-10-24 NOTE — Progress Notes (Signed)
NUTRITION FOLLOW UP  Intervention:   1.  General healthful diet; continue current management.  Continue to encourage intake as able.  Nutrition Dx:   Inadequate oral intake, ongoing  Monitor:   1.  Food/Beverage; diet advancement with tolerance. 2.  Head/neck; no s/s aspiration  Assessment:   Pt with Downs syndrome admitted from group home with acute respiratory distress, renal failure, and shock.  Pt was intubated and received Oxepa while intubated.  Pt has since been extubated.  Pt seen and assessed by SLP who determined pt appropriate for Dysphagia 2, thin liquids.  Pt is impulsive and requires assistance with meals.  Discussed in rounds. Per RN, pt is hungry and eating well.  She likes soda and ice cream.    Height: Ht Readings from Last 1 Encounters:  10/15/12 5\' 6"  (1.676 m)    Weight Status:   Wt Readings from Last 1 Encounters:  10/24/12 173 lb 4.5 oz (78.6 kg)    Re-estimated needs:  Kcal: 1700-1870 Protein: 56-68g Fluid: ~1.8 L/day   Skin: intact  Diet Order: Dysphagia 2, thin   Intake/Output Summary (Last 24 hours) at 10/24/12 0953 Last data filed at 10/24/12 0000  Gross per 24 hour  Intake   1770 ml  Output   1256 ml  Net    514 ml    Last BM: 12/19   Labs:   Lab 10/24/12 0330 10/23/12 0333 10/22/12 0345 10/20/12 0400 10/19/12 0440 10/18/12 0400  NA 140 144 147* -- -- --  K 3.5 3.6 4.0 -- -- --  CL 100 107 107 -- -- --  CO2 33* 34* 36* -- -- --  BUN 15 15 20  -- -- --  CREATININE 1.71* 1.53* 1.71* -- -- --  CALCIUM 8.2* 8.3* 8.5 -- -- --  MG -- -- -- 1.9 2.1 2.2  PHOS -- -- -- 2.3 1.9* 2.3  GLUCOSE 87 100* 70 -- -- --   CBG (last 3)   Basename 10/24/12 0735 10/24/12 0337 10/24/12 0028  GLUCAP 74 83 84    Scheduled Meds:    . amphetamine-dextroamphetamine  10 mg Oral Daily  . antiseptic oral rinse  15 mL Mouth Rinse q12n4p  . atomoxetine  40 mg Oral BID  . chlorhexidine  15 mL Mouth Rinse BID  . dextromethorphan-guaiFENesin  1  tablet Oral BID  . insulin aspart  2-6 Units Subcutaneous Q4H  . pantoprazole  40 mg Oral Daily  . QUEtiapine  400 mg Oral QHS  . Valproic Acid  1,000 mg Oral Daily  . Valproic Acid  1,500 mg Oral QHS   Continuous Infusions:    Loyce Dys, MS RD LDN Clinical Inpatient Dietitian Pager: [phone] Weekend/After hours pager: [phone]

## 2012-10-25 DIAGNOSIS — Q909 Down syndrome, unspecified: Secondary | ICD-10-CM

## 2012-10-25 DIAGNOSIS — J189 Pneumonia, unspecified organism: Secondary | ICD-10-CM

## 2012-10-25 LAB — GLUCOSE, CAPILLARY
Glucose-Capillary: 100 mg/dL — ABNORMAL HIGH (ref 70–99)
Glucose-Capillary: 118 mg/dL — ABNORMAL HIGH (ref 70–99)
Glucose-Capillary: 58 mg/dL — ABNORMAL LOW (ref 70–99)
Glucose-Capillary: 86 mg/dL (ref 70–99)
Glucose-Capillary: 87 mg/dL (ref 70–99)

## 2012-10-25 LAB — CULTURE, BLOOD (ROUTINE X 2): Culture: NO GROWTH

## 2012-10-25 MED ORDER — QUETIAPINE FUMARATE ER 400 MG PO TB24: 400.0000 mg | ORAL_TABLET | Freq: Every day | ORAL | Status: DC

## 2012-10-25 MED ORDER — VALPROIC ACID 250 MG/5ML PO SYRP: 1000.0000 mg | ORAL_SOLUTION | Freq: Two times a day (BID) | ORAL | Status: DC

## 2012-10-25 MED ORDER — DEXTROSE 50 % IV SOLN
INTRAVENOUS | Status: AC
  Filled 2012-10-25: qty 50

## 2012-10-25 MED ORDER — GUAIFENESIN ER 600 MG PO TB12: 1200.0000 mg | ORAL_TABLET | Freq: Two times a day (BID) | ORAL | Status: DC

## 2012-10-25 MED ORDER — ATOMOXETINE HCL 40 MG PO CAPS: 40.0000 mg | ORAL_CAPSULE | Freq: Two times a day (BID) | ORAL | Status: DC

## 2012-10-25 MED ORDER — AMPHETAMINE-DEXTROAMPHET ER 15 MG PO CP24: 15.0000 mg | ORAL_CAPSULE | Freq: Every morning | ORAL | Status: DC

## 2012-10-25 MED ORDER — LORAZEPAM 1 MG PO TABS: 1.0000 mg | ORAL_TABLET | Freq: Three times a day (TID) | ORAL | Status: DC

## 2012-10-25 NOTE — Progress Notes (Signed)
Pt refused chest vest 

## 2012-10-25 NOTE — Progress Notes (Signed)
Per MD, Pt medically ready for d/c.  Please see CSW note from [phone] for full assessment.  Per Pt's mom, Pt will not be returning to the group home. She will be by shortly to pick up her daughter and take her home.  Notified MD.  No further CSW needs identified.  Providence Crosby, LCSWA Clinical Social Work [phone]

## 2012-10-25 NOTE — Progress Notes (Signed)
Pt taking [place] on and off intermitt. As well as tele leads. Education done. Pt needs frequent reinforcement. Will continue to educate- Recruitment consultant at bedside.

## 2012-10-25 NOTE — Evaluation (Signed)
Physical Therapy Evaluation Patient Details Name: Margaret Cooper MRN: [id number] DOB: [birth year] Today's Date: 10/25/2012 Time: 4098-1191 PT Time Calculation (min): 12 min  PT Assessment / Plan / Recommendation Clinical Impression  Pt. was admiteed 12/11 with increased respiratory distress with hypoxia, influenza. Pt. has Down's syndrome. Pt was able to walk, recommend family accompany pt. when ambulating until strength improves. No further PT indicated. Pt. is for DC toda to home with mother/family.    PT Assessment       Follow Up Recommendations  No PT follow up    Does the patient have the potential to tolerate intense rehabilitation      Barriers to Discharge        Equipment Recommendations  None recommended by PT    Recommendations for Other Services     Frequency      Precautions / Restrictions Precautions Precautions: Fall         Mobility  Bed Mobility Bed Mobility: Supine to Sit;Sit to Supine Supine to Sit: 5: Supervision Sit to Supine: 5: Supervision Details for Bed Mobility Assistance: cues for carrying pout activity Transfers Transfers: Sit to Stand;Stand to Sit Sit to Stand: 4: Min guard Stand to Sit: 5: Supervision Ambulation/Gait Ambulation/Gait Assistance: 4: Min guard Ambulation Distance (Feet): 100 Feet Assistive device: Rolling walker Ambulation/Gait Assistance Details: walked 50 with RW, then 1 hand hold and 50 ft with only contact. Gait Pattern: Right steppage;Left steppage;Right genu recurvatum;Left genu recurvatum;Wide base of support    Shoulder Instructions     Exercises     PT Diagnosis:    PT Problem List:   PT Treatment Interventions:     PT Goals    Visit Information  Last PT Received On: 10/25/12 Assistance Needed: +1    Subjective Data  Subjective: I can walk Patient Stated Goal: to go home, ride in the [facility]   Prior Functioning  Home Living Lives With: Family Available Help at Discharge: Family Type of Home:  House Additional Comments: pt. resided in Group home but is now going to go home with mother. No info available Prior Function Level of Independence: Needs assistance Needs Assistance: Bathing;Dressing;Toileting    Cognition  Overall Cognitive Status: History of cognitive impairments - at baseline Arousal/Alertness: Awake/alert Behavior During Session: Agitated Cognition - Other Comments: mildly at times but easy to redirect.    Extremity/Trunk Assessment Right Upper Extremity Assessment RUE ROM/Strength/Tone: [facility] for tasks assessed Left Upper Extremity Assessment LUE ROM/Strength/Tone: WFL for tasks assessed Right Lower Extremity Assessment RLE ROM/Strength/Tone: [facility] for tasks assessed Left Lower Extremity Assessment LLE ROM/Strength/Tone: WFL for tasks assessed   Balance    End of Session PT - End of Session Activity Tolerance: Patient tolerated treatment well Patient left: in bed;with call bell/phone within reach;with nursing in room Nurse Communication: Mobility status  GP     Rada Hay 10/25/2012, 3:57 PM  [phone]

## 2012-10-25 NOTE — Discharge Summary (Signed)
Physician Discharge Summary  Margaret Cooper HQI:[id number] DOB: July 23, 1986 DOA: 10/15/2012  PCP: No primary provider on file.  Admit date: 10/15/2012 Discharge date: 10/25/2012  Time spent: >30 minutes  Recommendations for Outpatient Follow-up:  -Follow up with PCP in 2 weeks -Repeat CXR in 4 weeks to follow resolution on infiltrates and assess lungs structures -Needs CBC to follow platelets and WBC's trend during next visit -Also will require BMET to follow renal function and electrolytes.  Discharge Diagnoses:  Principal Problem:  *ARDS (adult respiratory distress syndrome) Active Problems:  Septic shock(785.52)  Acute respiratory failure with hypoxia  Pulmonary alveolar hemorrhage  Influenza A   Discharge Condition: stable and improved. Patient now with excellent O2 sat on RA; no CP, no SOB. Still with some scattered rhonchi but will follow IS and mucinex for that. She is going to be discharge home with mother care at this point.  Diet recommendation: dysphagia type 3 with thin liquids  Filed Weights   10/23/12 0600 10/24/12 0400 10/25/12 0539  Weight: 80.5 kg (177 lb 7.5 oz) 78.6 kg (173 lb 4.5 oz) 78.7 kg (173 lb 8 oz)    History of present illness:  26 yo Downs syndrome , group home pt, seen at MD office 1 week ago and tx with zithro for URI. Presented to [facility] ED 12-11 in acute resp distress, renal failure, shock. PCCM initially admitted patient since she required intubation; after improvement care was transferred to [facility].   Hospital Course:  1-Acute resp failure with hypoxia: secondary to ARDS, influenza, PNA and DAH.  -doing ok; no wheezing, decrease rhonchi and no longer requiring Oxygen supplementation.  -will continue mucinex BID  -continue IS  -follow up with PCP in 2 weeks.   2- Septic shock: now resolved. VSS  3-ARF with hypernatremia: Cr was still slightly elevated but trending down. Problem secondary to shock/sepsis/diuresis. At discharge Na WNL and Cr  1.6  4-Dysphagia: tolerating dysphagia type 3 diet and thin liquids. Will continue this diet for the next 7 days; then resume regular diet. Follow up with PCP as an outpatient.   5-Influenza and PNA: patient afebrile; completed tamiflu course and also levaquin course. Will continue mucinex, good hydration and IS.  6-Hyperglycemia: no hx of diabetes; advised to follow healthy diet. Follow up with PCP as an outpatient.  7-down's syndrome with behavioral disturbances: continue home regimen; appears to be stable and at baseline. Continue supportive care. Will go home with mother.  8-thrombocytopenia: due to sepsis most likely. Platelets pretty much WNL at discharge, no bruises and no further bleeding.  Procedures: Intubation for 5 days due to resp failure (12/11-12/16 CXR (Interstitial and airspace opacities, most pronounced within the  left lower lobe, improved since the prior)   Consultations:  CCM  Discharge Exam: Filed Vitals:   10/24/12 2129 10/25/12 0539 10/25/12 0700 10/25/12 1343  BP: 94/49 88/55 94/55  92/54  Pulse: 59 109  113  Temp: 97.2 F (36.2 C) 97.6 F (36.4 C)  97.7 F (36.5 C)  TempSrc: Oral Oral  Oral  Resp: 18 18  18   Height:      Weight:  78.7 kg (173 lb 8 oz)    SpO2: 95% 97%  99%   General: Afebrile, AAOX2 (probably baseline); breathing comfortable on RA, follow commands properly.  Cardiovascular: no rubs, no gallops, mild tachycardia  Respiratory: scattered rhonchi and significant improvement air movement. Good O2 sat on RA Abdomen: soft, NT, ND, positive BS  Neuro: AAOX2, follow commands, no focal deficit; CN  intact  Discharge Instructions  Discharge Orders    Future Orders Please Complete By Expires   Discharge instructions      Comments:   -Take medications as prescribed -Keep yourself well hydrated -Arrange follow up with PCP in 2 weeks -Use incentive spirometer as instructed at least 6 times a day (8 repetition on every session). -Dysphagia  3 diet (at least for the next 7 days)       Medication List     As of 10/25/2012  4:36 PM    STOP taking these medications         azithromycin 250 MG tablet   Commonly known as: ZITHROMAX      MUCINEX DM 30-600 MG Tb12      TAKE these medications         amphetamine-dextroamphetamine 15 MG 24 hr capsule   Commonly known as: ADDERALL XR   Take 1 capsule (15 mg total) by mouth every morning.      atomoxetine 40 MG capsule   Commonly known as: STRATTERA   Take 1 capsule (40 mg total) by mouth 2 (two) times daily.      guaiFENesin 600 MG 12 hr tablet   Commonly known as: MUCINEX   Take 2 tablets (1,200 mg total) by mouth 2 (two) times daily.      ibuprofen 200 MG tablet   Commonly known as: ADVIL,MOTRIN   Take 200 mg by mouth every 6 (six) hours as needed. pain      LORazepam 1 MG tablet   Commonly known as: ATIVAN   Take 1 tablet (1 mg total) by mouth 3 (three) times daily.      QUEtiapine 400 MG 24 hr tablet   Commonly known as: SEROQUEL XR   Take 1 tablet (400 mg total) by mouth at bedtime.      Valproic Acid 250 MG/5ML Syrp syrup   Commonly known as: DEPAKENE   Take 20-30 mLs (1,000-1,500 mg total) by mouth 2 (two) times daily. Pt takes 1000 mg ( 20 ml)  in the morning and 1500 mg at night ( 30 ml )          The results of significant diagnostics from this hospitalization (including imaging, microbiology, ancillary and laboratory) are listed below for reference.    Significant Diagnostic Studies: US Renal Port  10/15/2012  *RADIOLOGY REPORT*  Clinical Data: Renal failure.  Downs syndrome.  RENAL/URINARY TRACT ULTRASOUND COMPLETE - PORTABLE  Comparison:  None.  Findings:  Right Kidney:  No hydronephrosis.  Well-preserved cortex.  Normal size and parenchymal echotexture without focal abnormalities. Renal length 9.9 cm.  Left Kidney:  No hydronephrosis.  Well-preserved cortex.  Normal size and parenchymal echotexture without focal abnormalities. Renal length 9.7  cm.  Bladder:  Decompressed by Foley catheter and suboptimally evaluated.  IMPRESSION:  1.  The kidneys are symmetric in size without focal abnormality. 2.  No hydronephrosis.  Bladder decompressed by Foley catheter.   Original Report Authenticated By: Carey Bullocks, M.D.    Dg Chest Port 1 View  10/23/2012  *RADIOLOGY REPORT*  Clinical Data: Respiratory distress  PORTABLE CHEST - 1 VIEW  Comparison: 10/21/2012  Findings: Interstitial and airspace opacities, left greater than right, with mild interval improvement.  Trace effusions not excluded.  No pneumothorax.  Cardiomediastinal contours unchanged. Left IJ catheter has been removed.  No acute osseous finding.  IMPRESSION: Interstitial and airspace opacities, most pronounced within the left lower lobe, similar to slightly improved since the prior. Differential  includes infection and/or edema.   Original Report Authenticated By: Jearld Lesch, M.D.    Dg Chest Port 1 View  10/21/2012  *RADIOLOGY REPORT*  Clinical Data: Pneumonia.  PORTABLE CHEST - 1 VIEW  Comparison: 10/20/2012.  Findings: The endotracheal tube and NG tubes have been removed. The left IJ catheter is stable.  The cardiac silhouette, mediastinal and hilar contours are stable.  The lungs demonstrate worsening aeration with low lung volumes and probable increase in edema or infiltrate.  Persistent small pleural effusions.  IMPRESSION: Worsening aeration post extubation.   Original Report Authenticated By: Rudie Meyer, M.D.    Dg Chest Port 1 View  10/20/2012  *RADIOLOGY REPORT*  Clinical Data: Pleural effusions.  Intubated patient.  PORTABLE CHEST - 1 VIEW  Comparison: Chest 10/19/2012 and 10/18/2012.  Findings: The patient's left IJ catheter has backed out with the tip projecting at the junction of the left brachiocephalic vein and superior vena cava. Support apparatus is otherwise unchanged. Bilateral airspace disease and effusions persists with some increase in airspace disease in  the right base.  Heart size is normal.  No pneumothorax.  IMPRESSION:  1.  Right IJ catheter has backed out with the tip now projecting over the junction of the left brachiocephalic vein and superior vena cava. 2.  Bilateral airspace disease shows some worsening in the right base.   Original Report Authenticated By: Holley Dexter, M.D.    Dg Chest Port 1 View  10/19/2012  *RADIOLOGY REPORT*  Clinical Data: Check endotracheal tube  PORTABLE CHEST - 1 VIEW  Comparison: Chest radiograph 10/18/2012  Findings: Endotracheal tube, NG tube, and left central venous line are unchanged.  Stable heart silhouette.  There is a bibasilar air space disease which is diffuse in nature and not improved compared to prior.  Small bilateral pleural effusions.  No pneumothorax.  IMPRESSION: 1.  Stable support apparatus. 2.   No significant change. 3.  Bibasilar air space disease and effusions.   Original Report Authenticated By: Genevive Bi, M.D.    Dg Chest Port 1 View  10/18/2012  *RADIOLOGY REPORT*  Clinical Data: Endotracheal tube placement  PORTABLE CHEST - 1 VIEW  Comparison: Prior chest x-ray 10/17/2012  Findings: Tip of endotracheal tube is 3.8 cm above the carina. Unchanged position of left IJ central venous catheter.  Catheter tip projects over the lower SVC.  Incompletely imaged nasogastric tube, the tip lies below the diaphragm likely within the stomach.  Slightly improved aeration.  Persistent mildly enlarged globular contour of the cardiopericardial silhouette with a layering left pleural effusion and diffuse bilateral interstitial and airspace opacities most consistent with pulmonary edema and / or multi focal infection. No acute osseous abnormality.  IMPRESSION:  1.  Stable and satisfactory support apparatus. 2.  Slightly improved aeration over the last 24 hours. 3.  Persistent mildly globular enlargement of the cardiopericardial silhouette which may represent cardiomegaly or pericardial effusion. 4.   Suspect small left pleural effusion 5.  Diffuse bilateral interstitial and airspace opacities slightly more confluent in the left base than the right remain consistent with pulmonary edema versus multi focal infection.   Original Report Authenticated By: Malachy Moan, M.D.    Dg Chest Port 1 View  10/17/2012  *RADIOLOGY REPORT*  Clinical Data: Endotracheal tube position  PORTABLE CHEST - 1 VIEW  Comparison: 10/16/2012  Findings: Endotracheal tube tip 2.7 cm proximal to the carina. Left IJ catheter tip projects over the proximal SVC.  NG tube descends below level of the image.  The patient is rotated.  There are perihilar and bibasilar opacities.  Moderate left and small right pleural effusions.  No definite pneumothorax.  No acute osseous finding.  IMPRESSION: Cardiomegaly with pulmonary edema versus multifocal infection.  Bilateral pleural effusions with associated consolidations, left greater right  Support devices as above.   Original Report Authenticated By: Jearld Lesch, M.D.    Dg Chest Port 1 View  10/16/2012  *RADIOLOGY REPORT*  Clinical Data: Endotracheal tube placement  PORTABLE CHEST - 1 VIEW  Comparison: Chest radiograph 10/15/2012 no  Findings: Endotracheal tube and left central venous line are unchanged.  Interval placement NG tube which extends to the stomach.  Stable heart silhouette.  There is dense bibasilar air space disease unchanged from prior.  No pneumothorax.  IMPRESSION:  1.  Bibasilar dense air space disease not changed from prior. 2.  Interval placement NG tube into the stomach.   Original Report Authenticated By: Genevive Bi, M.D.    Dg Chest Port 1 View  10/15/2012  *RADIOLOGY REPORT*  Clinical Data: Intubation.  Central line placement  PORTABLE CHEST - 1 VIEW  Comparison: 10/15/2012  Findings: Endotracheal tube in good position.  Left jugular catheter tip in the SVC.  No pneumothorax.  Extensive bilateral airspace disease shows mild improvement from earlier  today.  This could be due to pneumonia or heart failure.  IMPRESSION: Endotracheal tube in good position.  Central venous catheter tip in good position.  Diffuse bilateral infiltrate or edema is slightly improved.   Original Report Authenticated By: Janeece Riggers, M.D.    Dg Chest Portable 1 View  10/15/2012  *RADIOLOGY REPORT*  Clinical Data: Cough.  Shortness of breath.  Hypoxemia.  Lethargy. Hypotension.  PORTABLE CHEST - 1 VIEW 10/15/2012 1201 hours:  Comparison: None.  Findings: Cardiac silhouette upper normal in size for the AP portable technique.  Airspace consolidation throughout the mid and lower lungs bilaterally, most confluent at the right base.  IMPRESSION: Bilateral pneumonia.   Original Report Authenticated By: Hulan Saas, M.D.     Microbiology: Recent Results (from the past 240 hour(s))  CULTURE, BLOOD (ROUTINE X 2)     Status: Normal   Collection Time   10/19/12 10:22 AM      Component Value Range Status Comment   Specimen Description BLOOD RIGHT ARM   Final    Special Requests BOTTLES DRAWN AEROBIC AND ANAEROBIC [facility] EACH   Final    Culture  Setup Time 10/19/2012 16:07   Final    Culture NO GROWTH 5 DAYS   Final    Report Status 10/25/2012 FINAL   Final   CULTURE, BLOOD (ROUTINE X 2)     Status: Normal   Collection Time   10/19/12 10:29 AM      Component Value Range Status Comment   Specimen Description BLOOD RIGHT ARM   Final    Special Requests BOTTLES DRAWN AEROBIC AND ANAEROBIC [facility] EACH   Final    Culture  Setup Time 10/19/2012 16:07   Final    Culture NO GROWTH 5 DAYS   Final    Report Status 10/25/2012 FINAL   Final   URINE CULTURE     Status: Normal   Collection Time   10/19/12 10:39 AM      Component Value Range Status Comment   Specimen Description URINE, CATHETERIZED   Final    Special Requests Normal   Final    Culture  Setup Time 10/19/2012 15:53   Final  Colony Count NO GROWTH   Final    Culture NO GROWTH   Final    Report Status 10/20/2012 FINAL    Final   CULTURE, BAL-QUANTITATIVE     Status: Normal   Collection Time   10/19/12 10:59 AM      Component Value Range Status Comment   Specimen Description BRONCHIAL ALVEOLAR LAVAGE   Final    Special Requests Normal   Final    Gram Stain     Final    Value: FEW WBC PRESENT, PREDOMINANTLY PMN     NO SQUAMOUS EPITHELIAL CELLS SEEN     NO ORGANISMS SEEN   Colony Count NO GROWTH   Final    Culture NO GROWTH 2 DAYS   Final    Report Status 10/21/2012 FINAL   Final      Labs: Basic Metabolic Panel:  Lab 10/24/12 4696 10/23/12 0333 10/22/12 0345 10/21/12 0400 10/20/12 0400 10/19/12 0440  NA 140 144 147* 149* 149* --  K 3.5 3.6 4.0 3.9 3.4* --  CL 100 107 107 113* 115* --  CO2 33* 34* 36* 32 27 --  GLUCOSE 87 100* 70 74 171* --  BUN 15 15 20 18 20  --  CREATININE 1.71* 1.53* 1.71* 1.32* 1.32* --  CALCIUM 8.2* 8.3* 8.5 8.3* 8.2* --  MG -- -- -- -- 1.9 2.1  PHOS -- -- -- -- 2.3 1.9*   CBC:  Lab 10/24/12 0330 10/23/12 0333 10/22/12 0345 10/21/12 0400 10/20/12 0400  WBC 12.7* 17.0* 18.4* 12.8* 8.9  NEUTROABS -- -- -- -- --  HGB 9.6* 9.9* 10.5* 10.0* 9.7*  HCT 30.3* 30.8* 33.5* 31.9* 29.8*  MCV 95.3 95.4 95.4 95.2 93.7  PLT 139* 137* 98* 76* 61*   BNP: BNP (last 3 results)  Basename 10/20/12 0400 10/19/12 0440  PROBNP 510.3* 832.7*   CBG:  Lab 10/25/12 1613 10/25/12 1121 10/25/12 0712 10/25/12 0427 10/25/12 0352  GLUCAP 86 77 87 86 58*    Signed:  Destane Speas  [facility] 10/25/2012, 4:36 PM

## 2012-10-25 NOTE — Progress Notes (Signed)
Hypoglycemic Event  CBG: 58  Treatment: 15 GM carbohydrate snack  Symptoms: None  Follow-up CBG: Time:4:31 AM  CBG Result:86  Possible Reasons for Event: Inadequate meal intake  Comments/MD notified    Margaret Cooper  Remember to initiate Hypoglycemia Order Set & complete

## 2013-01-09 ENCOUNTER — Encounter (HOSPITAL_COMMUNITY): Payer: Self-pay | Admitting: *Deleted

## 2013-01-09 ENCOUNTER — Emergency Department (HOSPITAL_COMMUNITY)
Admission: EM | Admit: 2013-01-09 | Discharge: 2013-01-10 | Disposition: A | Discharge status: Home / Self Care | Payer: Medicaid Other | Attending: Emergency Medicine | Admitting: Emergency Medicine

## 2013-01-09 DIAGNOSIS — F429 Obsessive-compulsive disorder, unspecified: Secondary | ICD-10-CM | POA: Insufficient documentation

## 2013-01-09 DIAGNOSIS — R1110 Vomiting, unspecified: Secondary | ICD-10-CM | POA: Insufficient documentation

## 2013-01-09 DIAGNOSIS — F909 Attention-deficit hyperactivity disorder, unspecified type: Secondary | ICD-10-CM | POA: Insufficient documentation

## 2013-01-09 DIAGNOSIS — J189 Pneumonia, unspecified organism: Secondary | ICD-10-CM | POA: Insufficient documentation

## 2013-01-09 DIAGNOSIS — F70 Mild intellectual disabilities: Secondary | ICD-10-CM | POA: Insufficient documentation

## 2013-01-09 DIAGNOSIS — Z79899 Other long term (current) drug therapy: Secondary | ICD-10-CM | POA: Insufficient documentation

## 2013-01-09 DIAGNOSIS — Q909 Down syndrome, unspecified: Secondary | ICD-10-CM | POA: Insufficient documentation

## 2013-01-09 MED ORDER — SODIUM CHLORIDE 0.9 % IV BOLUS (SEPSIS): 1000.0000 mL | Freq: Once | INTRAVENOUS | Status: DC

## 2013-01-09 MED ORDER — IBUPROFEN 800 MG PO TABS
800.0000 mg | ORAL_TABLET | Freq: Once | ORAL | Status: DC
  Filled 2013-01-09: qty 1

## 2013-01-09 MED ORDER — ONDANSETRON HCL 4 MG/2ML IJ SOLN
4.0000 mg | Freq: Once | INTRAMUSCULAR | Status: DC
  Filled 2013-01-09: qty 2

## 2013-01-09 MED ORDER — MORPHINE SULFATE 4 MG/ML IJ SOLN
4.0000 mg | Freq: Once | INTRAMUSCULAR | Status: DC
  Filled 2013-01-09: qty 1

## 2013-01-09 NOTE — ED Provider Notes (Signed)
History     CSN: [id number]  Arrival date & time 01/09/13  2157   First MD Initiated Contact with Patient 01/09/13 2314      Chief Complaint  Patient presents with  . Abdominal Pain  . Emesis    (Consider location/radiation/quality/duration/timing/severity/associated sxs/prior treatment) HPI Comments: Patient is a 27 year old female with a past medical history of Down's syndrome who presents with abdominal pain that started suddenly this evening. The pain is located in her generalized abdomen and does not radiate. The pain is severe and unable to be characterized. The pain started gradually and progressively worsened since the onset. No alleviating/aggravating factors. The patient has tried nothing for symptoms without relief. Associated symptoms include vomiting. Patient denies fever, headache, nausea, diarrhea, chest pain, SOB, dysuria, constipation, abnormal vaginal bleeding/discharge.      Patient is a 27 y.o. female presenting with abdominal pain and vomiting.  Abdominal Pain Associated symptoms: vomiting   Emesis Associated symptoms: abdominal pain     Past Medical History  Diagnosis Date  . ADD (attention deficit disorder with hyperactivity)   . Mild mental retardation   . Down's syndrome   . OCD (obsessive compulsive disorder)     Past Surgical History  Procedure Laterality Date  . Examination under anesthesia  [id number]  . Examination under anesthesia  11/14/2011    Procedure: EXAM UNDER ANESTHESIA;  Surgeon: Tereso Newcomer, MD;  Location: [facility];  Service: Gynecology;  Laterality: N/A;  Pap smear/down syndrome patient  . Tooth extraction  01/25/2012    Procedure: DENTAL RESTORATION/EXTRACTIONS;  Surgeon: Esaw Dace., DDS;  Location: [facility] OR;  Service: Oral Surgery;  Laterality: Bilateral;  recal exam, full mouth xrays, cleaning, T extraction, Two OL, Three DOL, Four O, Twelve O, Fourteen O, Fifteen O, Eighteen OF, Nineteen F, Twenty-eight F,     History  reviewed. No pertinent family history.  History  Substance Use Topics  . Smoking status: Never Smoker   . Smokeless tobacco: Never Used  . Alcohol Use: No    OB History   Grav Para Term Preterm Abortions TAB SAB Ect Mult Living   0 0 0 0 0 0 0 0 0 0       Review of Systems  Gastrointestinal: Positive for vomiting and abdominal pain.  All other systems reviewed and are negative.    Allergies  Review of patient's allergies indicates no known allergies.  Home Medications   Current Outpatient Rx  Name  Route  Sig  Dispense  Refill  . amphetamine-dextroamphetamine (ADDERALL) 15 MG tablet   Oral   Take 15 mg by mouth daily.          Marland Kitchen atomoxetine (STRATTERA) 40 MG capsule   Oral   Take 40 mg by mouth 2 (two) times daily.          . hydrOXYzine (ATARAX/VISTARIL) 25 MG tablet   Oral   Take 25 mg by mouth 2 (two) times daily as needed. For itching         . levonorgestrel-ethinyl estradiol (SEASONALE,INTROVALE,JOLESSA) 0.15-0.03 MG tablet   Oral   Take 1 tablet by mouth daily.   1 Package   12   . LORazepam (ATIVAN) 1 MG tablet   Oral   Take 1 mg by mouth every 8 (eight) hours.          Marland Kitchen OVER THE COUNTER MEDICATION   Transdermal   Place 1 application onto the skin 2 (two) times daily. Gold Bond lotion  apply BID         . promethazine (PHENERGAN) 25 MG tablet   Oral   Take 25 mg by mouth every 6 (six) hours as needed. For nausea         . QUEtiapine (SEROQUEL) 400 MG tablet   Oral   Take 400 mg by mouth at bedtime.          . Valproic Acid (DEPAKENE) 250 MG/5ML SYRP syrup   Oral   Take 500-1,500 mg by mouth See admin instructions. Take 1000mg  (4 teaspoons) in am and 1500mg  (6 teaspoons) every night           BP 108/81  Pulse 137  Resp 16  SpO2 96%  LMP 12/21/2012  Physical Exam  Nursing note and vitals reviewed. Constitutional: She is oriented to person, place, and time. She appears well-developed and well-nourished. No distress.   HENT:  Head: Normocephalic and atraumatic.  Eyes: Conjunctivae and EOM are normal. Pupils are equal, round, and reactive to light. No scleral icterus.  Neck: Normal range of motion. Neck supple.  Cardiovascular: Normal rate and regular rhythm.  Exam reveals no gallop and no friction rub.   No murmur heard. Pulmonary/Chest: Effort normal and breath sounds normal. She has no wheezes. She has no rales. She exhibits no tenderness.  Abdominal: Soft. She exhibits no distension. There is tenderness. There is no rebound and no guarding.  Periumbilical tenderness to palpation. No peritoneal signs.   Musculoskeletal: Normal range of motion.  Neurological: She is alert and oriented to person, place, and time. Coordination normal.  Speech is goal-oriented. Moves limbs without ataxia.   Skin: Skin is warm and dry.  Psychiatric:  Difficult to assess due to Down's Syndrome.     ED Course  Procedures (including critical care time)  Labs Reviewed  CBC WITH DIFFERENTIAL - Abnormal; Notable for the following:    WBC 18.0 (*)    Neutrophils Relative 87 (*)    Neutro Abs 15.7 (*)    Lymphocytes Relative 9 (*)    All other components within normal limits  COMPREHENSIVE METABOLIC PANEL - Abnormal; Notable for the following:    Glucose, Bld 119 (*)    Creatinine, Ser 1.20 (*)    Albumin 3.1 (*)    GFR calc non Af Amer 61 (*)    GFR calc Af Amer 71 (*)    All other components within normal limits  LIPASE, BLOOD  URINALYSIS, ROUTINE W REFLEX MICROSCOPIC   Ct Abdomen Pelvis W Contrast  01/10/2013  *RADIOLOGY REPORT*  Clinical Data: Abdominal pain and vomiting.  CT ABDOMEN AND PELVIS WITH CONTRAST  Technique:  Multidetector CT imaging of the abdomen and pelvis was performed following the standard protocol during bolus administration of intravenous contrast.  Contrast: 100 mL of Omnipaque 300 IV contrast  Comparison: Pelvic ultrasound performed 10/14/2006  Findings: Patchy airspace opacities are noted  within the right lung base, concerning for multifocal pneumonia.  Diffuse wall thickening along the distal esophagus raises concern for esophagitis.  The liver and spleen are unremarkable in appearance.  The gallbladder is within normal limits.  The pancreas and adrenal glands are unremarkable.  The kidneys are unremarkable in appearance.  There is no evidence of hydronephrosis.  No renal or ureteral stones are seen.  No perinephric stranding is appreciated.  No free fluid is identified.  The small bowel is unremarkable in appearance.  The stomach is within normal limits.  No acute vascular abnormalities are seen.  The  appendix is normal in caliber and contains contrast, without evidence for appendicitis.  Contrast progresses to the level of the ascending colon.  The colon is largely decompressed and is grossly unremarkable in appearance.  The bladder is decompressed and grossly unremarkable.  The uterus is unremarkable in appearance.  The ovaries are relatively symmetric; no suspicious adnexal masses are seen.  No inguinal lymphadenopathy is seen.  No acute osseous abnormalities are identified.  IMPRESSION:  1.  Diffuse wall thickening along the distal esophagus, raising concern for esophagitis. 2.  Patchy airspace opacities at the right lung base, concerning for multifocal pneumonia.   Original Report Authenticated By: Tonia Ghent, M.D.      1. CAP (community acquired pneumonia)       MDM  11:35 PM Labs pending. Patient will have ibuprofen PO for pain. Patient will have CT abdomen.   3:29 AM Labs show elevated WBC. Patient's CT shows pneumonia without acute abdominal changes. Patient will be discharged with Levaquin and Zofran for symptoms. No further evaluation needed at this time. Patient instructed to return with worsening or concerning symptoms. Vitals stable for discharge.       Emilia Beck, [place] 01/10/13 [phone]

## 2013-01-09 NOTE — ED Notes (Signed)
Mother reports pt vomiting x 1 (clear phlegm), reports abdominal pain, abdomen non-tender to palpation and pt reports pain all over. Pt is refusing to allow [place] to take oral temp.

## 2013-01-09 NOTE — ED Notes (Signed)
Pt BIB EMS. Pt has hx of Down's Syndrome and arrives accompanied by mother. Per EMS, pt developed a sudden onset of abdominal pain with one episode of n/v. Last BM today. Pt is normal to baseline with unremarkable physical exam per EMS. Pt ambulatory with steady gait to ambulance and triage lobby.

## 2013-01-09 NOTE — ED Notes (Signed)
Bed:WA06<BR> Expected date:<BR> Expected time:<BR> Means of arrival:<BR> Comments:<BR>

## 2013-01-09 NOTE — ED Notes (Signed)
Pt. With MR, uncooperative, agitated unable to get rectal temp, urine and blood draw. Pt. Refused. MD. Notified.

## 2013-01-10 ENCOUNTER — Emergency Department (HOSPITAL_COMMUNITY): Payer: Medicaid Other

## 2013-01-10 LAB — CBC WITH DIFFERENTIAL/PLATELET
Basophils Relative: 0 % (ref 0–1)
Eosinophils Absolute: 0 10*3/uL (ref 0.0–0.7)
MCH: 29.3 pg (ref 26.0–34.0)
MCHC: 33.2 g/dL (ref 30.0–36.0)
Neutrophils Relative %: 87 % — ABNORMAL HIGH (ref 43–77)
Platelets: 266 10*3/uL (ref 150–400)

## 2013-01-10 LAB — COMPREHENSIVE METABOLIC PANEL
ALT: 10 U/L (ref 0–35)
Albumin: 3.1 g/dL — ABNORMAL LOW (ref 3.5–5.2)
Alkaline Phosphatase: 48 U/L (ref 39–117)
BUN: 7 mg/dL (ref 6–23)
Calcium: 9.1 mg/dL (ref 8.4–10.5)
Potassium: 3.5 mEq/L (ref 3.5–5.1)
Sodium: 138 mEq/L (ref 135–145)
Total Protein: 7.2 g/dL (ref 6.0–8.3)

## 2013-01-10 LAB — LIPASE, BLOOD: Lipase: 20 U/L (ref 11–59)

## 2013-01-10 MED ORDER — IOHEXOL 300 MG/ML  SOLN
100.0000 mL | Freq: Once | INTRAMUSCULAR | Status: AC | PRN
  Administered 2013-01-10: 100 mL via INTRAVENOUS

## 2013-01-10 MED ORDER — ONDANSETRON 4 MG PO TBDP: 8.0000 mg | ORAL_TABLET | Freq: Three times a day (TID) | ORAL | Status: DC | PRN

## 2013-01-10 MED ORDER — LEVOFLOXACIN 500 MG PO TABS: 500.0000 mg | ORAL_TABLET | Freq: Every day | ORAL | Status: DC

## 2013-01-10 MED ORDER — IOHEXOL 300 MG/ML  SOLN
50.0000 mL | Freq: Once | INTRAMUSCULAR | Status: AC | PRN
  Administered 2013-01-10: 50 mL via ORAL

## 2013-01-10 NOTE — ED Notes (Signed)
Patient transported to CT 

## 2013-01-10 NOTE — ED Provider Notes (Signed)
Medical screening examination/treatment/procedure(s) were performed by non-physician practitioner and as supervising physician I was immediately available for consultation/collaboration.  Olivia Mackie, MD 01/10/13 [phone]

## 2013-01-10 NOTE — Discharge Instructions (Signed)
Take Levaquin as directed until gone. Take zofran as needed for nausea and vomiting. Refer to attached documents for more information regarding your diagnosis. Return to the ED with worsening or concerning symptoms.

## 2013-01-10 NOTE — ED Notes (Signed)
Pt. Spit out medicine, uncooperative. Mother at bedside. Pt. Has MR, PA notified of pt.'s refusal for all treatments and tests.

## 2013-01-12 ENCOUNTER — Encounter (HOSPITAL_COMMUNITY): Payer: Self-pay | Admitting: *Deleted

## 2013-01-12 ENCOUNTER — Emergency Department (HOSPITAL_COMMUNITY)
Admission: EM | Admit: 2013-01-12 | Discharge: 2013-01-12 | Disposition: A | Discharge status: Home / Self Care | Payer: Medicaid Other | Attending: Emergency Medicine | Admitting: Emergency Medicine

## 2013-01-12 DIAGNOSIS — Z79899 Other long term (current) drug therapy: Secondary | ICD-10-CM | POA: Insufficient documentation

## 2013-01-12 DIAGNOSIS — T365X5A Adverse effect of aminoglycosides, initial encounter: Secondary | ICD-10-CM | POA: Insufficient documentation

## 2013-01-12 DIAGNOSIS — F429 Obsessive-compulsive disorder, unspecified: Secondary | ICD-10-CM | POA: Insufficient documentation

## 2013-01-12 DIAGNOSIS — Q909 Down syndrome, unspecified: Secondary | ICD-10-CM | POA: Insufficient documentation

## 2013-01-12 DIAGNOSIS — T365X1A Poisoning by aminoglycosides, accidental (unintentional), initial encounter: Secondary | ICD-10-CM | POA: Insufficient documentation

## 2013-01-12 DIAGNOSIS — F988 Other specified behavioral and emotional disorders with onset usually occurring in childhood and adolescence: Secondary | ICD-10-CM | POA: Insufficient documentation

## 2013-01-12 MED ORDER — FAMOTIDINE 20 MG PO TABS: 20.0000 mg | ORAL_TABLET | Freq: Two times a day (BID) | ORAL | Status: DC

## 2013-01-12 MED ORDER — DIPHENHYDRAMINE HCL 25 MG PO CAPS
25.0000 mg | ORAL_CAPSULE | Freq: Once | ORAL | Status: AC
  Administered 2013-01-12: 25 mg via ORAL
  Filled 2013-01-12: qty 1

## 2013-01-12 MED ORDER — DIPHENHYDRAMINE HCL 25 MG PO CAPS: 25.0000 mg | ORAL_CAPSULE | Freq: Four times a day (QID) | ORAL | Status: DC | PRN

## 2013-01-12 MED ORDER — FAMOTIDINE 20 MG PO TABS
20.0000 mg | ORAL_TABLET | Freq: Once | ORAL | Status: AC
  Administered 2013-01-12: 20 mg via ORAL
  Filled 2013-01-12: qty 1

## 2013-01-12 MED ORDER — PREDNISONE 20 MG PO TABS
40.0000 mg | ORAL_TABLET | Freq: Once | ORAL | Status: AC
  Administered 2013-01-12: 40 mg via ORAL
  Filled 2013-01-12: qty 2

## 2013-01-12 MED ORDER — AZITHROMYCIN 250 MG PO TABS: 250.0000 mg | ORAL_TABLET | Freq: Every day | ORAL | Status: DC

## 2013-01-12 MED ORDER — PREDNISONE 20 MG PO TABS: 40.0000 mg | ORAL_TABLET | Freq: Every day | ORAL | Status: DC

## 2013-01-12 NOTE — ED Notes (Signed)
Pt from home accompanied by mother with reports of being treated here on Friday with diagnosis of pneumonia. Pt has taken 2 doses of oral antibiotics and began to have redness with minimal swelling on face Saturday. Pt's mother denies any symptoms of difficulty breathing or swallowing. Pt in no acute distress.

## 2013-01-12 NOTE — ED Provider Notes (Signed)
History    This chart was scribed for non-physician practitioner working with Lyanne Co, MD by ED Scribe, Burman Nieves. This patient was seen in room WTR5/WTR5 and the patient's care was started at 3:06 PM.   CSN: [id number]  Arrival date & time 01/12/13  1408   First MD Initiated Contact with Patient 01/12/13 1506     Level 5 Caveat  Chief Complaint  Patient presents with  . Rash    redness to face    (Consider location/radiation/quality/duration/timing/severity/associated sxs/prior treatment) The history is provided by a parent. No language interpreter was used.     HIAWATHA DRESSEL is a 27 y.o. female with h/o Down Syndrome who presents to the Emergency Department complaining of moderate constant rash onset Saturday. Pt was seen in ED Friday and was diagnosed with Community Acquired Pneumonia. She was prescribed Levaquin and given to her by her mother on Saturday. Mother reports after pt ingested Levaquin, pt developed rash on right side of  face causing itching and irritation. Mother reports pt keeps rubbing irritated region and her eyes due to itching sensation on right side of face. Pt denies fever, chills, cough, nausea, vomiting, diarrhea, SOB, weakness, and any other associated symptoms. Mother has not noticed any swelling of the lips, tongue, or throat.  No difficulty swallowing or breathing.  Pt has no known allergies to any other medications.     Past Medical History  Diagnosis Date  . ADD (attention deficit disorder with hyperactivity)   . Mild mental retardation   . Down's syndrome   . OCD (obsessive compulsive disorder)     Past Surgical History  Procedure Laterality Date  . Examination under anesthesia  [id number]  . Examination under anesthesia  11/14/2011    Procedure: EXAM UNDER ANESTHESIA;  Surgeon: Tereso Newcomer, MD;  Location: [facility];  Service: Gynecology;  Laterality: N/A;  Pap smear/down syndrome patient  . Tooth extraction  01/25/2012    Procedure:  DENTAL RESTORATION/EXTRACTIONS;  Surgeon: Esaw Dace., DDS;  Location: [facility] OR;  Service: Oral Surgery;  Laterality: Bilateral;  recal exam, full mouth xrays, cleaning, T extraction, Two OL, Three DOL, Four O, Twelve O, Fourteen O, Fifteen O, Eighteen OF, Nineteen F, Twenty-eight F,     History reviewed. No pertinent family history.  History  Substance Use Topics  . Smoking status: Never Smoker   . Smokeless tobacco: Never Used  . Alcohol Use: No    OB History   Grav Para Term Preterm Abortions TAB SAB Ect Mult Living   0 0 0 0 0 0 0 0 0 0       Review of Systems  Unable to perform ROS: Patient nonverbal  Constitutional: Negative for fever and chills.  Skin: Positive for rash.  All other systems reviewed and are negative.    Allergies  Review of patient's allergies indicates no known allergies.  Home Medications   Current Outpatient Rx  Name  Route  Sig  Dispense  Refill  . amphetamine-dextroamphetamine (ADDERALL) 15 MG tablet   Oral   Take 15 mg by mouth daily.          Marland Kitchen atomoxetine (STRATTERA) 40 MG capsule   Oral   Take 40 mg by mouth 2 (two) times daily.          . hydrOXYzine (ATARAX/VISTARIL) 25 MG tablet   Oral   Take 25 mg by mouth 2 (two) times daily as needed. For itching         .  levofloxacin (LEVAQUIN) 500 MG tablet   Oral   Take 1 tablet (500 mg total) by mouth daily.   7 tablet   0   . levonorgestrel-ethinyl estradiol (SEASONALE,INTROVALE,JOLESSA) 0.15-0.03 MG tablet   Oral   Take 1 tablet by mouth daily.   1 Package   12   . LORazepam (ATIVAN) 1 MG tablet   Oral   Take 1 mg by mouth every 8 (eight) hours.          . ondansetron (ZOFRAN ODT) 4 MG disintegrating tablet   Oral   Take 2 tablets (8 mg total) by mouth every 8 (eight) hours as needed for nausea.   20 tablet   0   . OVER THE COUNTER MEDICATION   Transdermal   Place 1 application onto the skin 2 (two) times daily. Gold Bond lotion apply BID         .  promethazine (PHENERGAN) 25 MG tablet   Oral   Take 25 mg by mouth every 6 (six) hours as needed. For nausea         . QUEtiapine (SEROQUEL) 400 MG tablet   Oral   Take 400 mg by mouth at bedtime.          . Valproic Acid (DEPAKENE) 250 MG/5ML SYRP syrup   Oral   Take 500-1,500 mg by mouth See admin instructions. Take 1000mg  (4 teaspoons) in am and 1500mg  (6 teaspoons) every night           BP 107/77  Pulse 100  Temp(Src) 97.6 F (36.4 C) (Oral)  Resp 20  SpO2 100%  LMP 12/21/2012  Physical Exam  Nursing note and vitals reviewed. Constitutional: She is oriented to person, place, and time. She appears well-developed and well-nourished. No distress.  HENT:  Head: Normocephalic and atraumatic.  Right Ear: External ear normal.  Mouth/Throat: Oropharynx is clear and moist.  erythemas scaly rash on face more so on right side. No swelling of the lips, tongue, and throat  Eyes: Conjunctivae and EOM are normal. Pupils are equal, round, and reactive to light. Left eye exhibits no discharge. No scleral icterus.  Neck: Normal range of motion. Neck supple.  Cardiovascular: Normal rate, regular rhythm, normal heart sounds and intact distal pulses.   Pulmonary/Chest: Effort normal and breath sounds normal. No stridor. No respiratory distress. She has no wheezes.  Abdominal: Bowel sounds are normal. She exhibits no distension. There is no tenderness.  Musculoskeletal: Normal range of motion. She exhibits no edema and no tenderness.  Neurological: She is alert and oriented to person, place, and time. She has normal strength.  Skin: Skin is warm and dry. Rash noted. She is not diaphoretic.  Psychiatric: She has a normal mood and affect.    ED Course  Procedures (including critical care time)  DIAGNOSTIC STUDIES: Oxygen Saturation is 100% on room air, normal by my interpretation.    COORDINATION OF CARE:  3:25 PM Discussed ED treatment with pt and pt agrees.    Labs Reviewed -  No data to display No results found.   No diagnosis found.    MDM  Patient presenting with what appears to be an allergic reaction to the Levaquin that was started on 01/09/13.  No respiratory distress.  No signs of angioedema or anaphylaxis.  Patient instructed to stop taking the Levaquin and take Ceftriaxone instead.  Patient given Pepcid, Prednisone, and Benadryl in the ED.  Patient also given prescriptions for these medications.  Strict return precautions given.  I personally performed the services described in this documentation, which was scribed in my presence. The recorded information has been reviewed and is accurate.    Pascal Lux [place], PA-C 01/12/13 1616

## 2013-01-12 NOTE — ED Notes (Signed)
MD at bedside. 

## 2013-01-13 NOTE — ED Provider Notes (Signed)
Medical screening examination/treatment/procedure(s) were performed by non-physician practitioner and as supervising physician I was immediately available for consultation/collaboration.   Kevin M Campos, MD 01/13/13 0059 

## 2013-01-21 ENCOUNTER — Encounter (HOSPITAL_COMMUNITY): Payer: Self-pay | Admitting: Emergency Medicine

## 2013-01-21 ENCOUNTER — Emergency Department (INDEPENDENT_AMBULATORY_CARE_PROVIDER_SITE_OTHER)
Admission: EM | Admit: 2013-01-21 | Discharge: 2013-01-21 | Disposition: A | Discharge status: Home / Self Care | Payer: Medicaid Other | Source: Home / Self Care | Attending: Emergency Medicine | Admitting: Emergency Medicine

## 2013-01-21 DIAGNOSIS — A088 Other specified intestinal infections: Secondary | ICD-10-CM

## 2013-01-21 LAB — POCT URINALYSIS DIP (DEVICE)
Ketones, ur: 40 mg/dL — AB
Protein, ur: 30 mg/dL — AB
Specific Gravity, Urine: >=1.03 (ref 1.005–1.030)
pH: 6 (ref 5.0–8.0)

## 2013-01-21 LAB — CBC WITH DIFFERENTIAL/PLATELET
Basophils Relative: 1 % (ref 0–1)
HCT: 42.4 % (ref 36.0–46.0)
Hemoglobin: 14.3 g/dL (ref 12.0–15.0)
Lymphs Abs: 2.6 10*3/uL (ref 0.7–4.0)
MCHC: 33.7 g/dL (ref 30.0–36.0)
Monocytes Absolute: 0.5 10*3/uL (ref 0.1–1.0)
Monocytes Relative: 7 % (ref 3–12)
Neutro Abs: 4.8 10*3/uL (ref 1.7–7.7)
RBC: 5 MIL/uL (ref 3.87–5.11)

## 2013-01-21 LAB — POCT I-STAT, CHEM 8
BUN: 10 mg/dL (ref 6–23)
Calcium, Ion: 1.16 mmol/L (ref 1.12–1.23)
Chloride: 107 mEq/L (ref 96–112)
Creatinine, Ser: 1 mg/dL (ref 0.50–1.10)
Glucose, Bld: 79 mg/dL (ref 70–99)
Potassium: 3.9 mEq/L (ref 3.5–5.1)

## 2013-01-21 MED ORDER — ONDANSETRON 4 MG PO TBDP
8.0000 mg | ORAL_TABLET | Freq: Once | ORAL | Status: AC
  Administered 2013-01-21: 8 mg via ORAL

## 2013-01-21 MED ORDER — ONDANSETRON 8 MG PO TBDP: 8.0000 mg | ORAL_TABLET | Freq: Three times a day (TID) | ORAL | Status: DC | PRN

## 2013-01-21 MED ORDER — PSEUDOEPH-BROMPHEN-DM 30-2-10 MG/5ML PO SYRP: 5.0000 mL | ORAL_SOLUTION | Freq: Four times a day (QID) | ORAL | Status: DC | PRN

## 2013-01-21 MED ORDER — ONDANSETRON 4 MG PO TBDP
ORAL_TABLET | ORAL | Status: AC
  Filled 2013-01-21: qty 2

## 2013-01-21 NOTE — ED Notes (Signed)
Pt is accompanied by mother; hx of down syndrome C/o abd pain since yest Sx include; vomiting up phlegm/mucous, productive cough, loss of appetite Denies: f/d/urinary problems, SOB, wheezing Seen at [facility] x2 this month for the same reason; given antibiotics; mom states that pt developed an allergic reaction to the meds but does not know which one it was Drinking fluids well  Pt is alert w/no signs of acute distress; mother in the room w/pt

## 2013-01-21 NOTE — ED Provider Notes (Signed)
Chief Complaint:   Chief Complaint  Patient presents with  . Abdominal Pain    History of Present Illness:   Margaret Cooper is a 27 year old female with Down's syndrome. She's had a history since yesterday of mid abdominal pain, nausea, and vomiting. She has not had any fever or diarrhea. She denies any urinary symptoms. The pain is diffuse and not localized. She's not been needing or drinking were well.  Review of Systems:  Other than noted above, the patient denies any of the following symptoms: Systemic:  No fevers, chills, sweats, weight loss or gain, fatigue, or tiredness. ENT:  No nasal congestion, rhinorrhea, or sore throat. Lungs:  No cough, wheezing, or shortness of breath. Cardiac:  No chest pain, syncope, or presyncope. GI:  No abdominal pain, nausea, vomiting, anorexia, diarrhea, constipation, blood in stool or vomitus. GU:  No dysuria, frequency, or urgency.  PMFSH:  Past medical history, family history, social history, meds, and allergies were reviewed.  Physical Exam:   Vital signs:  BP 113/78  Pulse 118  Temp(Src) 97.6 F (36.4 C) (Oral)  Resp 16  SpO2 96%  LMP 12/21/2012 General:  Alert and oriented.  In no distress.  Skin warm and dry.  Good skin turgor, brisk capillary refill. She has outward signs of Down syndrome. ENT:  No scleral icterus, moist mucous membranes, no oral lesions, pharynx clear. Lungs:  Breath sounds clear and equal bilaterally.  No wheezes, rales, or rhonchi. Heart:  Rhythm regular, without extrasystoles.  No gallops or murmers. Abdomen:  Soft, flat, nondistended. There is mild, generalized tenderness to palpation without guarding or rebound. There is no localized tenderness to palpation. No organomegaly or mass. Bowel sounds were normally active. Skin: Clear, warm, and dry.  Good turgor.  Brisk capillary refill.  Labs:   Results for orders placed during the hospital encounter of 01/21/13  CBC WITH DIFFERENTIAL      Result Value Range   WBC  8.0  4.0 - 10.5 K/uL   RBC 5.00  3.87 - 5.11 MIL/uL   Hemoglobin 14.3  12.0 - 15.0 g/dL   HCT 08.6  57.8 - 46.9 %   MCV 84.8  78.0 - 100.0 fL   MCH 28.6  26.0 - 34.0 pg   MCHC 33.7  30.0 - 36.0 g/dL   RDW 62.9  52.8 - 41.3 %   Platelets 266  150 - 400 K/uL   Neutrophils Relative 60  43 - 77 %   Neutro Abs 4.8  1.7 - 7.7 K/uL   Lymphocytes Relative 32  12 - 46 %   Lymphs Abs 2.6  0.7 - 4.0 K/uL   Monocytes Relative 7  3 - 12 %   Monocytes Absolute 0.5  0.1 - 1.0 K/uL   Eosinophils Relative 1  0 - 5 %   Eosinophils Absolute 0.1  0.0 - 0.7 K/uL   Basophils Relative 1  0 - 1 %   Basophils Absolute 0.1  0.0 - 0.1 K/uL  POCT URINALYSIS DIP (DEVICE)      Result Value Range   Glucose, UA NEGATIVE  NEGATIVE mg/dL   Bilirubin Urine MODERATE (*) NEGATIVE   Ketones, ur 40 (*) NEGATIVE mg/dL   Specific Gravity, Urine >=1.030  1.005 - 1.030   Hgb urine dipstick SMALL (*) NEGATIVE   pH 6.0  5.0 - 8.0   Protein, ur 30 (*) NEGATIVE mg/dL   Urobilinogen, UA 1.0  0.0 - 1.0 mg/dL   Nitrite NEGATIVE  NEGATIVE  Leukocytes, UA NEGATIVE  NEGATIVE  POCT PREGNANCY, URINE      Result Value Range   Preg Test, Ur NEGATIVE  NEGATIVE  POCT I-STAT, CHEM 8      Result Value Range   Sodium 144  135 - 145 mEq/L   Potassium 3.9  3.5 - 5.1 mEq/L   Chloride 107  96 - 112 mEq/L   BUN 10  6 - 23 mg/dL   Creatinine, Ser 1.19  0.50 - 1.10 mg/dL   Glucose, Bld 79  70 - 99 mg/dL   Calcium, Ion 1.47  8.29 - 1.23 mmol/L   TCO2 28  0 - 100 mmol/L   Hemoglobin 15.6 (*) 12.0 - 15.0 g/dL   HCT 56.2  13.0 - 86.5 %     Course in Urgent Care Center:   Given Zofran ODT sublingually and a fluid challenge which she was able to keep down.  Assessment:  The encounter diagnosis was Viral gastroenteritis.  Plan:   1.  The following meds were prescribed:   Discharge Medication List as of 01/21/2013  3:21 PM    START taking these medications   Details  brompheniramine-pseudoephedrine-DM 30-2-10 MG/5ML syrup Take 5 mLs  by mouth 4 (four) times daily as needed., Starting 01/21/2013, Until Discontinued, Normal    !! ondansetron (ZOFRAN ODT) 8 MG disintegrating tablet Take 1 tablet (8 mg total) by mouth every 8 (eight) hours as needed for nausea., Starting 01/21/2013, Until Discontinued, Normal     !! - Potential duplicate medications found. Please discuss with provider.     2.  The patient was instructed in symptomatic care and handouts were given. 3.  The patient was told to return if becoming worse in any way, if no better in 2 or 3 days, and given some red flag symptoms such as worsening pain, fever, intractable vomiting that would indicate earlier return. 4.  The patient was told to take only sips of clear liquids for the next 24 hours and then advance to a b.r.a.t. Diet.      Reuben Likes, MD 01/21/13 2138

## 2013-02-11 ENCOUNTER — Encounter (HOSPITAL_COMMUNITY): Payer: Self-pay | Admitting: *Deleted

## 2013-02-11 ENCOUNTER — Emergency Department (HOSPITAL_COMMUNITY)
Admission: EM | Admit: 2013-02-11 | Discharge: 2013-02-11 | Disposition: A | Discharge status: Home / Self Care | Payer: Medicaid Other | Attending: Emergency Medicine | Admitting: Emergency Medicine

## 2013-02-11 DIAGNOSIS — Q909 Down syndrome, unspecified: Secondary | ICD-10-CM | POA: Insufficient documentation

## 2013-02-11 DIAGNOSIS — F29 Unspecified psychosis not due to a substance or known physiological condition: Secondary | ICD-10-CM | POA: Insufficient documentation

## 2013-02-11 DIAGNOSIS — F919 Conduct disorder, unspecified: Secondary | ICD-10-CM | POA: Insufficient documentation

## 2013-02-11 DIAGNOSIS — F909 Attention-deficit hyperactivity disorder, unspecified type: Secondary | ICD-10-CM | POA: Insufficient documentation

## 2013-02-11 DIAGNOSIS — N390 Urinary tract infection, site not specified: Secondary | ICD-10-CM | POA: Insufficient documentation

## 2013-02-11 DIAGNOSIS — R21 Rash and other nonspecific skin eruption: Secondary | ICD-10-CM | POA: Insufficient documentation

## 2013-02-11 DIAGNOSIS — R1110 Vomiting, unspecified: Secondary | ICD-10-CM | POA: Insufficient documentation

## 2013-02-11 DIAGNOSIS — R109 Unspecified abdominal pain: Secondary | ICD-10-CM

## 2013-02-11 DIAGNOSIS — F429 Obsessive-compulsive disorder, unspecified: Secondary | ICD-10-CM | POA: Insufficient documentation

## 2013-02-11 LAB — CBC WITH DIFFERENTIAL/PLATELET
Basophils Relative: 0 % (ref 0–1)
Eosinophils Absolute: 0 10*3/uL (ref 0.0–0.7)
HCT: 39.6 % (ref 36.0–46.0)
Hemoglobin: 13 g/dL (ref 12.0–15.0)
Lymphs Abs: 1.8 10*3/uL (ref 0.7–4.0)
MCH: 27.8 pg (ref 26.0–34.0)
MCHC: 32.8 g/dL (ref 30.0–36.0)
Monocytes Absolute: 0.9 10*3/uL (ref 0.1–1.0)
Monocytes Relative: 6 % (ref 3–12)
Neutro Abs: 12.2 10*3/uL — ABNORMAL HIGH (ref 1.7–7.7)
Neutrophils Relative %: 82 % — ABNORMAL HIGH (ref 43–77)
RBC: 4.68 MIL/uL (ref 3.87–5.11)

## 2013-02-11 LAB — COMPREHENSIVE METABOLIC PANEL
Albumin: 2.8 g/dL — ABNORMAL LOW (ref 3.5–5.2)
Alkaline Phosphatase: 50 U/L (ref 39–117)
BUN: 8 mg/dL (ref 6–23)
Chloride: 104 mEq/L (ref 96–112)
Creatinine, Ser: 1.09 mg/dL (ref 0.50–1.10)
GFR calc Af Amer: 79 mL/min — ABNORMAL LOW (ref >90)
Glucose, Bld: 111 mg/dL — ABNORMAL HIGH (ref 70–99)
Potassium: 3.5 mEq/L (ref 3.5–5.1)
Total Bilirubin: 0.6 mg/dL (ref 0.3–1.2)

## 2013-02-11 LAB — URINALYSIS, ROUTINE W REFLEX MICROSCOPIC
Bilirubin Urine: NEGATIVE
Glucose, UA: NEGATIVE mg/dL
Ketones, ur: NEGATIVE mg/dL
Nitrite: NEGATIVE
Specific Gravity, Urine: 1.02 (ref 1.005–1.030)
pH: 5.5 (ref 5.0–8.0)

## 2013-02-11 LAB — URINE MICROSCOPIC-ADD ON

## 2013-02-11 LAB — LIPASE, BLOOD: Lipase: 19 U/L (ref 11–59)

## 2013-02-11 MED ORDER — ONDANSETRON 4 MG PO TBDP
4.0000 mg | ORAL_TABLET | Freq: Once | ORAL | Status: AC
  Administered 2013-02-11: 4 mg via ORAL
  Filled 2013-02-11: qty 1

## 2013-02-11 MED ORDER — GI COCKTAIL ~~LOC~~
30.0000 mL | Freq: Once | ORAL | Status: AC
  Administered 2013-02-11: 30 mL via ORAL
  Filled 2013-02-11: qty 30

## 2013-02-11 MED ORDER — SULFAMETHOXAZOLE-TRIMETHOPRIM 800-160 MG PO TABS: 1.0000 | ORAL_TABLET | Freq: Two times a day (BID) | ORAL | Status: DC

## 2013-02-11 NOTE — ED Notes (Addendum)
Seen in ED for same complaint multiple times.  Pt is accompanied by mother; hx of down syndrome, c/o abd pain, seen by pcp 3/19, since then has been seen at urgent care. Has taken antibiotics. Pt mother reports pt vomited phlegm/mucous last night and this morning. productive cough, loss of appetite.   Pt reports abdominal pain. Unable to give numerical number from pain scale.

## 2013-02-11 NOTE — ED Notes (Signed)
Pt escorted to discharge window. Pt verbalized understanding discharge instructions. In no acute distress.  

## 2013-02-11 NOTE — ED Provider Notes (Signed)
History     CSN: [id number]  Arrival date & time 02/11/13  0907   First MD Initiated Contact with Patient 02/11/13 [phone]      Chief Complaint  Patient presents with  . Abdominal Pain     HPI  Patient presents with her mother and grandmother.  They provide history of present illness the patient is not verbal.  The patient has Down's syndrome, and was a group home residents approximately 3 months ago when she began living with her mother. Patient's mother states that this time.  The patient has had constant abdominal pain, intermittent emesis.  The patient seems to have clear sputum that was seems more consistent with saliva then with fluid production, and this has been happening without relief from multiple trials of antibiotics, antiemetics. A report of new fever, confusion, behavioral changes, by mouth intake, stool changes, urinary changes. The patient has been evaluated here, and other facilities during this time course with studies including CT scan, x-ray, with no notable findings.  Past Medical History  Diagnosis Date  . ADD (attention deficit disorder with hyperactivity)   . Mild mental retardation   . Down's syndrome   . OCD (obsessive compulsive disorder)     Past Surgical History  Procedure Laterality Date  . Examination under anesthesia  [id number]  . Examination under anesthesia  11/14/2011    Procedure: EXAM UNDER ANESTHESIA;  Surgeon: Tereso Newcomer, MD;  Location: [facility];  Service: Gynecology;  Laterality: N/A;  Pap smear/down syndrome patient  . Tooth extraction  01/25/2012    Procedure: DENTAL RESTORATION/EXTRACTIONS;  Surgeon: Esaw Dace., DDS;  Location: [facility] OR;  Service: Oral Surgery;  Laterality: Bilateral;  recal exam, full mouth xrays, cleaning, T extraction, Two OL, Three DOL, Four O, Twelve O, Fourteen O, Fifteen O, Eighteen OF, Nineteen F, Twenty-eight F,     History reviewed. No pertinent family history.  History  Substance Use Topics  .  Smoking status: Never Smoker   . Smokeless tobacco: Never Used  . Alcohol Use: No    OB History   Grav Para Term Preterm Abortions TAB SAB Ect Mult Living   0 0 0 0 0 0 0 0 0 0       Review of Systems  Unable to perform ROS: Patient nonverbal    Allergies  Levaquin  Home Medications   Current Outpatient Rx  Name  Route  Sig  Dispense  Refill  . ondansetron (ZOFRAN ODT) 8 MG disintegrating tablet   Oral   Take 1 tablet (8 mg total) by mouth every 8 (eight) hours as needed for nausea.   20 tablet   0   . brompheniramine-pseudoephedrine-DM 30-2-10 MG/5ML syrup   Oral   Take 5 mLs by mouth 4 (four) times daily as needed.   473 mL   0   . OVER THE COUNTER MEDICATION   Transdermal   Place 1 application onto the skin 2 (two) times daily. Gold Bond lotion apply BID         . sulfamethoxazole-trimethoprim (SEPTRA DS) 800-160 MG per tablet   Oral   Take 1 tablet by mouth every 12 (twelve) hours.   10 tablet   0     BP 96/67  Pulse 99  Temp(Src) 98.4 F (36.9 C) (Oral)  Resp 15  SpO2 96%  Physical Exam  Nursing note and vitals reviewed. Constitutional: She is oriented to person, place, and time. She appears well-developed and well-nourished. No distress.  Young  F, smiling, laughing  HENT:  Head: Normocephalic and atraumatic.  Right Ear: External ear normal.  Mouth/Throat: Oropharynx is clear and moist.  erythemas scaly rash on face more so on right side. No swelling of the lips, tongue, and throat  Eyes: Conjunctivae and EOM are normal. Pupils are equal, round, and reactive to light. Left eye exhibits no discharge. No scleral icterus.  Neck: Normal range of motion. Neck supple.  Cardiovascular: Normal rate, regular rhythm, normal heart sounds and intact distal pulses.   Pulmonary/Chest: Effort normal and breath sounds normal. No stridor. No respiratory distress. She has no wheezes.  Abdominal: Bowel sounds are normal. She exhibits no distension. There is no  tenderness.  Musculoskeletal: Normal range of motion. She exhibits no edema and no tenderness.  Neurological: She is alert and oriented to person, place, and time. She has normal strength.  Skin: Skin is warm and dry. Rash noted. She is not diaphoretic.  Psychiatric: She has a normal mood and affect.    ED Course  Procedures (including critical care time)  Labs Reviewed  URINALYSIS, ROUTINE W REFLEX MICROSCOPIC - Abnormal; Notable for the following:    APPearance CLOUDY (*)    Leukocytes, UA SMALL (*)    All other components within normal limits  CBC WITH DIFFERENTIAL - Abnormal; Notable for the following:    WBC 14.9 (*)    Neutrophils Relative 82 (*)    Neutro Abs 12.2 (*)    All other components within normal limits  COMPREHENSIVE METABOLIC PANEL - Abnormal; Notable for the following:    Glucose, Bld 111 (*)    Albumin 2.8 (*)    GFR calc non Af Amer 68 (*)    GFR calc Af Amer 79 (*)    All other components within normal limits  URINE MICROSCOPIC-ADD ON - Abnormal; Notable for the following:    Squamous Epithelial / LPF FEW (*)    Bacteria, UA FEW (*)    All other components within normal limits  URINE CULTURE  LIPASE, BLOOD   No results found.   1. Abdominal pain   2. UTI (lower urinary tract infection)       MDM  This young female with Down's syndrome presents with ongoing concern of abdominal pain on the part of the patient's mother and grandmother.  On exam the patient is in no distress, afebrile, smiling, laughing and watching TV.  The patient's exam is reassuring with a soft abdomen, with no evidence of peritonitis, nor any systemic distress.  I had a lengthy discussion with the patient's mother and grandmother regarding emergency room evaluation, and the need for ongoing evaluation if no emergent needs were identified, as seems to have been done several times in the past.  We discussed the need for primary care followup, which the patient's mother states will  happen tomorrow, as well as consideration of specialist followup if needed and that point.  The patient's labs are largely reassuring, though there is no leukocytosis, mild suggestion of urinary tract infection.  Patient was prescribed an antibiotic for this.  Distress, with stable vital signs the patient is appropriate for outpatient management.  With a soft abdomen, no fever, no distress, additional imaging is not indicated.      Gerhard Munch, MD 02/11/13 1123

## 2013-02-11 NOTE — ED Notes (Addendum)
Pt alert and oriented x4. Respirations even and unlabored, bilateral symmetrical rise and fall of chest. Skin warm and dry. In no acute distress. Denies needs.  md at bedside 

## 2013-02-12 LAB — URINE CULTURE

## 2013-12-19 ENCOUNTER — Emergency Department (INDEPENDENT_AMBULATORY_CARE_PROVIDER_SITE_OTHER)
Admission: EM | Admit: 2013-12-19 | Discharge: 2013-12-19 | Disposition: A | Discharge status: Home / Self Care | Payer: Medicaid Other | Source: Home / Self Care | Attending: Family Medicine | Admitting: Family Medicine

## 2013-12-19 ENCOUNTER — Emergency Department (INDEPENDENT_AMBULATORY_CARE_PROVIDER_SITE_OTHER): Payer: Medicaid Other

## 2013-12-19 ENCOUNTER — Encounter (HOSPITAL_COMMUNITY): Payer: Self-pay | Admitting: Emergency Medicine

## 2013-12-19 DIAGNOSIS — R071 Chest pain on breathing: Secondary | ICD-10-CM

## 2013-12-19 DIAGNOSIS — R0789 Other chest pain: Secondary | ICD-10-CM

## 2013-12-19 MED ORDER — DEXTROMETHORPHAN POLISTIREX 30 MG/5ML PO LQCR: 60.0000 mg | Freq: Two times a day (BID) | ORAL | Status: DC

## 2013-12-19 NOTE — ED Provider Notes (Signed)
CSN: [id number]     Arrival date & time 12/19/13  1352 History   First MD Initiated Contact with Patient 12/19/13 1448     Chief Complaint  Patient presents with  . Chest Pain     (Consider location/radiation/quality/duration/timing/severity/associated sxs/prior Treatment) Patient is a 28 y.o. female presenting with chest pain. The history is provided by the patient and a parent.  Chest Pain Pain location:  R lateral chest and L lateral chest Pain quality: sharp   Pain radiates to:  Does not radiate Pain radiates to the back: no   Pain severity:  Mild Onset quality:  Gradual Duration:  1 week Progression:  Unchanged Chronicity:  New Context: movement   Associated symptoms: cough   Associated symptoms: no fever and no shortness of breath     Past Medical History  Diagnosis Date  . ADD (attention deficit disorder with hyperactivity)   . Mild mental retardation   . Down's syndrome   . OCD (obsessive compulsive disorder)    Past Surgical History  Procedure Laterality Date  . Examination under anesthesia  [id number]  . Examination under anesthesia  11/14/2011    Procedure: EXAM UNDER ANESTHESIA;  Surgeon: Tereso NewcomerUgonna A Anyanwu, MD;  Location: [facility];  Service: Gynecology;  Laterality: N/A;  Pap smear/down syndrome patient  . Tooth extraction  01/25/2012    Procedure: DENTAL RESTORATION/EXTRACTIONS;  Surgeon: Esaw DaceWilliam E Milner Jr., DDS;  [facility];  Service: Oral Surgery;  Laterality: Bilateral;  recal exam, full mouth xrays, cleaning, T extraction, Two OL, Three DOL, Four O, Twelve O, Fourteen O, Fifteen O, Eighteen OF, Nineteen F, Twenty-eight F,    History reviewed. No pertinent family history. History  Substance Use Topics  . Smoking status: Never Smoker   . Smokeless tobacco: Never Used  . Alcohol Use: No   OB History   Grav Para Term Preterm Abortions TAB SAB Ect Mult Living   0 0 0 0 0 0 0 0 0 0      Review of Systems  Constitutional: Negative.  Negative for fever.   HENT: Negative.   Respiratory: Positive for cough. Negative for chest tightness and shortness of breath.   Cardiovascular: Positive for chest pain.  Gastrointestinal: Negative.       Allergies  Levaquin  Home Medications   Current Outpatient Rx  Name  Route  Sig  Dispense  Refill  . brompheniramine-pseudoephedrine-DM 30-2-10 MG/5ML syrup   Oral   Take 5 mLs by mouth 4 (four) times daily as needed.   473 mL   0   . dextromethorphan (DELSYM) 30 MG/5ML liquid   Oral   Take 10 mLs (60 mg total) by mouth 2 (two) times daily. For cough   89 mL   1   . ondansetron (ZOFRAN ODT) 8 MG disintegrating tablet   Oral   Take 1 tablet (8 mg total) by mouth every 8 (eight) hours as needed for nausea.   20 tablet   0   . OVER THE COUNTER MEDICATION   Transdermal   Place 1 application onto the skin 2 (two) times daily. Gold Bond lotion apply BID         . sulfamethoxazole-trimethoprim (SEPTRA DS) 800-160 MG per tablet   Oral   Take 1 tablet by mouth every 12 (twelve) hours.   10 tablet   0    BP 101/61  Pulse 97  Temp(Src) 98.3 F (36.8 C) (Oral)  Resp 18  SpO2 100%  LMP 11/28/2013 Physical Exam  Nursing note and vitals reviewed. Constitutional: She appears well-developed and well-nourished.  Neck: Normal range of motion. Neck supple.  Cardiovascular: Normal rate and regular rhythm.   Pulmonary/Chest: Effort normal and breath sounds normal. She exhibits tenderness.  Abdominal: Soft. Bowel sounds are normal.  Lymphadenopathy:    She has no cervical adenopathy.  Neurological: She is alert.  Skin: Skin is warm and dry.    ED Course  Procedures (including critical care time) Labs Review Labs Reviewed - No data to display Imaging Review Dg Chest 2 View  12/19/2013   CLINICAL DATA:  Bilateral chest pain.  EXAM: CHEST  2 VIEW  COMPARISON:  11/08/2009 and 10/28/2009  FINDINGS: Lungs are adequately inflated without consolidation or effusion. The cardiomediastinal  silhouette and remainder of the exam is unchanged.  IMPRESSION: No active cardiopulmonary disease.   Electronically Signed   By: Elberta Fortis M.D.   On: 12/19/2013 15:46     X-rays reviewed and report per radiologist.   MDM   Final diagnoses:  Anterior chest wall pain        Linna Hoff, MD 12/19/13 1600

## 2013-12-19 NOTE — ED Notes (Signed)
Reported bilateral lower rib area pain w swelling. No known injury

## 2014-02-09 ENCOUNTER — Encounter (HOSPITAL_COMMUNITY): Payer: Self-pay | Admitting: Emergency Medicine

## 2014-02-09 DIAGNOSIS — Z87798 Personal history of other (corrected) congenital malformations: Secondary | ICD-10-CM | POA: Insufficient documentation

## 2014-02-09 DIAGNOSIS — F411 Generalized anxiety disorder: Secondary | ICD-10-CM | POA: Insufficient documentation

## 2014-02-09 DIAGNOSIS — J159 Unspecified bacterial pneumonia: Secondary | ICD-10-CM | POA: Insufficient documentation

## 2014-02-09 DIAGNOSIS — Z79899 Other long term (current) drug therapy: Secondary | ICD-10-CM | POA: Insufficient documentation

## 2014-02-09 NOTE — ED Notes (Addendum)
Pt reports left sided flank pain since this evening. Denies any other complaint, including urinary symptoms, abdominal pain, N/V/D. Hx: Down Syndrome

## 2014-02-10 ENCOUNTER — Emergency Department (HOSPITAL_COMMUNITY): Payer: Medicaid Other

## 2014-02-10 ENCOUNTER — Emergency Department (HOSPITAL_COMMUNITY)
Admission: EM | Admit: 2014-02-10 | Discharge: 2014-02-10 | Disposition: A | Discharge status: Home / Self Care | Payer: Medicaid Other | Attending: Emergency Medicine | Admitting: Emergency Medicine

## 2014-02-10 DIAGNOSIS — J189 Pneumonia, unspecified organism: Secondary | ICD-10-CM

## 2014-02-10 LAB — CBC WITH DIFFERENTIAL/PLATELET
Basophils Absolute: 0 10*3/uL (ref 0.0–0.1)
Basophils Relative: 0 % (ref 0–1)
Eosinophils Absolute: 0 10*3/uL (ref 0.0–0.7)
Eosinophils Relative: 0 % (ref 0–5)
HCT: 36.1 % (ref 36.0–46.0)
Hemoglobin: 11.9 g/dL — ABNORMAL LOW (ref 12.0–15.0)
LYMPHS ABS: 1 10*3/uL (ref 0.7–4.0)
LYMPHS PCT: 7 % — AB (ref 12–46)
MCH: 28.6 pg (ref 26.0–34.0)
MCHC: 33 g/dL (ref 30.0–36.0)
MCV: 86.8 fL (ref 78.0–100.0)
Monocytes Absolute: 0.8 10*3/uL (ref 0.1–1.0)
Monocytes Relative: 6 % (ref 3–12)
NEUTROS ABS: 13 10*3/uL — AB (ref 1.7–7.7)
NEUTROS PCT: 87 % — AB (ref 43–77)
PLATELETS: 227 10*3/uL (ref 150–400)
RBC: 4.16 MIL/uL (ref 3.87–5.11)
RDW: 14.6 % (ref 11.5–15.5)
WBC: 14.8 10*3/uL — AB (ref 4.0–10.5)

## 2014-02-10 LAB — URINE MICROSCOPIC-ADD ON

## 2014-02-10 LAB — COMPREHENSIVE METABOLIC PANEL
ALK PHOS: 51 U/L (ref 39–117)
ALT: 6 U/L (ref 0–35)
AST: 19 U/L (ref 0–37)
Albumin: 3.3 g/dL — ABNORMAL LOW (ref 3.5–5.2)
BUN: 18 mg/dL (ref 6–23)
CHLORIDE: 102 meq/L (ref 96–112)
CO2: 24 meq/L (ref 19–32)
Calcium: 8.8 mg/dL (ref 8.4–10.5)
Creatinine, Ser: 1.02 mg/dL (ref 0.50–1.10)
GFR, EST AFRICAN AMERICAN: 85 mL/min — AB (ref >90)
GFR, EST NON AFRICAN AMERICAN: 74 mL/min — AB (ref >90)
GLUCOSE: 95 mg/dL (ref 70–99)
POTASSIUM: 3.5 meq/L — AB (ref 3.7–5.3)
SODIUM: 141 meq/L (ref 137–147)
TOTAL PROTEIN: 6.9 g/dL (ref 6.0–8.3)
Total Bilirubin: 0.7 mg/dL (ref 0.3–1.2)

## 2014-02-10 LAB — URINALYSIS, ROUTINE W REFLEX MICROSCOPIC
Bilirubin Urine: NEGATIVE
GLUCOSE, UA: NEGATIVE mg/dL
HGB URINE DIPSTICK: NEGATIVE
Ketones, ur: NEGATIVE mg/dL
Nitrite: NEGATIVE
PROTEIN: NEGATIVE mg/dL
SPECIFIC GRAVITY, URINE: 1.026 (ref 1.005–1.030)
Urobilinogen, UA: 0.2 mg/dL (ref 0.0–1.0)
pH: 5.5 (ref 5.0–8.0)

## 2014-02-10 LAB — LIPASE, BLOOD: Lipase: 32 U/L (ref 11–59)

## 2014-02-10 MED ORDER — AMOXICILLIN 500 MG PO CAPS
1000.0000 mg | ORAL_CAPSULE | Freq: Once | ORAL | Status: AC
Start: 2014-02-10 — End: 2014-02-10
  Administered 2014-02-10: 1000 mg via ORAL
  Filled 2014-02-10: qty 2

## 2014-02-10 MED ORDER — DEXTROSE 5 % IV SOLN
1.0000 g | Freq: Once | INTRAVENOUS | Status: DC
  Filled 2014-02-10: qty 10

## 2014-02-10 MED ORDER — MORPHINE SULFATE 4 MG/ML IJ SOLN
INTRAMUSCULAR | Status: AC
  Administered 2014-02-10: 02:00:00
  Filled 2014-02-10: qty 1

## 2014-02-10 MED ORDER — ONDANSETRON HCL 4 MG/2ML IJ SOLN
INTRAMUSCULAR | Status: AC
  Administered 2014-02-10: 02:00:00
  Filled 2014-02-10: qty 2

## 2014-02-10 MED ORDER — AMOXICILLIN 500 MG PO CAPS: 1000.0000 mg | ORAL_CAPSULE | Freq: Three times a day (TID) | ORAL | Status: DC

## 2014-02-10 MED ORDER — TRAMADOL HCL 50 MG PO TABS
50.0000 mg | ORAL_TABLET | Freq: Four times a day (QID) | ORAL | Status: DC | PRN
Start: 2014-02-10 — End: 2015-08-17

## 2014-02-10 NOTE — ED Provider Notes (Signed)
CSN: [id number]     Arrival date & time 02/09/14  2139 History   First MD Initiated Contact with Patient 02/10/14 [phone]     Chief Complaint  Patient presents with  . Flank Pain     (Consider location/radiation/quality/duration/timing/severity/associated sxs/prior Treatment) Patient is a 28 y.o. female presenting with flank pain. The history is provided by a parent. The history is limited by a developmental delay.  Flank Pain  Mother states that she has been having pain in the left upper abdomen/lower chest for about the last month but it got significantly worse tonight. There is no associated nausea, vomiting, diarrhea. There's been no obvious fever or cough. Nothing seems to make it better nothing seems to make it worse.  Past Medical History  Diagnosis Date  . ADD (attention deficit disorder with hyperactivity)   . Mild mental retardation   . Down's syndrome   . OCD (obsessive compulsive disorder)    Past Surgical History  Procedure Laterality Date  . Examination under anesthesia  [id number]  . Examination under anesthesia  11/14/2011    Procedure: EXAM UNDER ANESTHESIA;  Surgeon: Tereso Newcomer, MD;  Location: [facility];  Service: Gynecology;  Laterality: N/A;  Pap smear/down syndrome patient  . Tooth extraction  01/25/2012    Procedure: DENTAL RESTORATION/EXTRACTIONS;  Surgeon: Esaw Dace., DDS;  Location: [facility] OR;  Service: Oral Surgery;  Laterality: Bilateral;  recal exam, full mouth xrays, cleaning, T extraction, Two OL, Three DOL, Four O, Twelve O, Fourteen O, Fifteen O, Eighteen OF, Nineteen F, Twenty-eight F,    History reviewed. No pertinent family history. History  Substance Use Topics  . Smoking status: Never Smoker   . Smokeless tobacco: Never Used  . Alcohol Use: No   OB History   Grav Para Term Preterm Abortions TAB SAB Ect Mult Living   0 0 0 0 0 0 0 0 0 0      Review of Systems  Unable to perform ROS: Other  Genitourinary: Positive for flank pain.       Allergies  Levaquin  Home Medications   Current Outpatient Rx  Name  Route  Sig  Dispense  Refill  . Cholecalciferol (VITAMIN D PO)   Oral   Take 1 tablet by mouth daily.         Marland Kitchen ibuprofen (ADVIL,MOTRIN) 200 MG tablet   Oral   Take 200 mg by mouth daily as needed (pain).         . naproxen sodium (ALEVE) 220 MG tablet   Oral   Take 220 mg by mouth 2 (two) times daily as needed (pain).          BP 101/55  Pulse 120  Temp(Src) 99.3 F (37.4 C) (Oral)  Resp 18  SpO2 98%  LMP 01/16/2014 Physical Exam  Nursing note and vitals reviewed.  28 year old female, resting comfortably and in no acute distress. Vital signs are normal. Oxygen saturation is 99%, which is normal. Head is normocephalic and atraumatic. PERRLA, EOMI. Oropharynx is clear. Neck is nontender and supple without adenopathy or JVD. Back is nontender and there is no CVA tenderness. Lungs are clear without rales, wheezes, or rhonchi. Chest is nontender. Heart has regular rate and rhythm without murmur. Abdomen is soft, flat, nontender without masses or hepatosplenomegaly and peristalsis is normoactive. Extremities have no cyanosis or edema, full range of motion is present. Skin is warm and dry without rash. Neurologic: She is awake and aware and very anxious  but is nonverbal, cranial nerves are intact, there are no motor or sensory deficits.  ED Course  Procedures (including critical care time) Labs Review Results for orders placed during the hospital encounter of 02/10/14  URINALYSIS, ROUTINE W REFLEX MICROSCOPIC      Result Value Ref Range   Color, Urine YELLOW  YELLOW   APPearance CLOUDY (*) CLEAR   Specific Gravity, Urine 1.026  1.005 - 1.030   pH 5.5  5.0 - 8.0   Glucose, UA NEGATIVE  NEGATIVE mg/dL   Hgb urine dipstick NEGATIVE  NEGATIVE   Bilirubin Urine NEGATIVE  NEGATIVE   Ketones, ur NEGATIVE  NEGATIVE mg/dL   Protein, ur NEGATIVE  NEGATIVE mg/dL   Urobilinogen, UA 0.2  0.0 -  1.0 mg/dL   Nitrite NEGATIVE  NEGATIVE   Leukocytes, UA SMALL (*) NEGATIVE  URINE MICROSCOPIC-ADD ON      Result Value Ref Range   Squamous Epithelial / LPF MANY (*) RARE   WBC, UA 0-2  <3 WBC/hpf   Bacteria, UA MANY (*) RARE  CBC WITH DIFFERENTIAL      Result Value Ref Range   WBC 14.8 (*) 4.0 - 10.5 K/uL   RBC 4.16  3.87 - 5.11 MIL/uL   Hemoglobin 11.9 (*) 12.0 - 15.0 g/dL   HCT 40.936.1  81.136.0 - 91.446.0 %   MCV 86.8  78.0 - 100.0 fL   MCH 28.6  26.0 - 34.0 pg   MCHC 33.0  30.0 - 36.0 g/dL   RDW 78.214.6  95.611.5 - 21.315.5 %   Platelets 227  150 - 400 K/uL   Neutrophils Relative % 87 (*) 43 - 77 %   Neutro Abs 13.0 (*) 1.7 - 7.7 K/uL   Lymphocytes Relative 7 (*) 12 - 46 %   Lymphs Abs 1.0  0.7 - 4.0 K/uL   Monocytes Relative 6  3 - 12 %   Monocytes Absolute 0.8  0.1 - 1.0 K/uL   Eosinophils Relative 0  0 - 5 %   Eosinophils Absolute 0.0  0.0 - 0.7 K/uL   Basophils Relative 0  0 - 1 %   Basophils Absolute 0.0  0.0 - 0.1 K/uL  COMPREHENSIVE METABOLIC PANEL      Result Value Ref Range   Sodium 141  137 - 147 mEq/L   Potassium 3.5 (*) 3.7 - 5.3 mEq/L   Chloride 102  96 - 112 mEq/L   CO2 24  19 - 32 mEq/L   Glucose, Bld 95  70 - 99 mg/dL   BUN 18  6 - 23 mg/dL   Creatinine, Ser 0.861.02  0.50 - 1.10 mg/dL   Calcium 8.8  8.4 - 57.810.5 mg/dL   Total Protein 6.9  6.0 - 8.3 g/dL   Albumin 3.3 (*) 3.5 - 5.2 g/dL   AST 19  0 - 37 U/L   ALT 6  0 - 35 U/L   Alkaline Phosphatase 51  39 - 117 U/L   Total Bilirubin 0.7  0.3 - 1.2 mg/dL   GFR calc non Af Amer 74 (*) >90 mL/min   GFR calc Af Amer 85 (*) >90 mL/min  LIPASE, BLOOD      Result Value Ref Range   Lipase 32  11 - 59 U/L   Imaging Review Chest x-ray shows left lower lobe infiltrate. Images are viewed by myself.  MDM   Final diagnoses:  Community acquired pneumonia    Left lower chest/upper abdomen pain of uncertain cause. There  no real findings on physical exam 2.2 an obvious cause. Urinalysis will be obtained to rule out UTI, and chest  x-ray will be obtained. Old records are reviewed and she did have a CT scan one year ago which showed no evidence of nephrolithiasis making kidney stones very unlikely.  CBC is come back moderately elevated and chest x-ray shows left lower lobe pneumonia. This is consistent with where she has been having her symptoms. She does not appear toxic and she is afebrile in the ED and maintaining good oxygen saturations. She is very anxious about being in the hospital setting so I feel she would do better at home. She's given an injection of ceftriaxone and is discharged with prescription for amoxicillin and she is to followup with her PCP in 2 days for recheck.    Dione Booze, MD 02/10/14 [phone]

## 2014-02-10 NOTE — ED Notes (Signed)
See downtime charting for this patient.

## 2014-02-10 NOTE — ED Notes (Signed)
IV site infiltrated, and was removed prior to starting Rocephin.  Informed Dr. Preston FleetingGlick, and antibiotic was changed to PO instead.  Informed family.

## 2014-02-10 NOTE — Discharge Instructions (Signed)
Return if symptoms are getting worse, or if there is no trend toward improving after two days.  Pneumonia, Adult Pneumonia is an infection of the lungs.  CAUSES Pneumonia may be caused by bacteria or a virus. Usually, these infections are caused by breathing infectious particles into the lungs (respiratory tract). SYMPTOMS   Cough.  Fever.  Chest pain.  Increased rate of breathing.  Wheezing.  Mucus production. DIAGNOSIS  If you have the common symptoms of pneumonia, your caregiver will typically confirm the diagnosis with a chest X-ray. The X-ray will show an abnormality in the lung (pulmonary infiltrate) if you have pneumonia. Other tests of your blood, urine, or sputum may be done to find the specific cause of your pneumonia. Your caregiver may also do tests (blood gases or pulse oximetry) to see how well your lungs are working. TREATMENT  Some forms of pneumonia may be spread to other people when you cough or sneeze. You may be asked to wear a mask before and during your exam. Pneumonia that is caused by bacteria is treated with antibiotic medicine. Pneumonia that is caused by the influenza virus may be treated with an antiviral medicine. Most other viral infections must run their course. These infections will not respond to antibiotics.  PREVENTION A pneumococcal shot (vaccine) is available to prevent a common bacterial cause of pneumonia. This is usually suggested for:  People over 54 years old.  Patients on chemotherapy.  People with chronic lung problems, such as bronchitis or emphysema.  People with immune system problems. If you are over 65 or have a high risk condition, you may receive the pneumococcal vaccine if you have not received it before. In some countries, a routine influenza vaccine is also recommended. This vaccine can help prevent some cases of pneumonia.You may be offered the influenza vaccine as part of your care. If you smoke, it is time to quit. You may  receive instructions on how to stop smoking. Your caregiver can provide medicines and counseling to help you quit. HOME CARE INSTRUCTIONS   Cough suppressants may be used if you are losing too much rest. However, coughing protects you by clearing your lungs. You should avoid using cough suppressants if you can.  Your caregiver may have prescribed medicine if he or she thinks your pneumonia is caused by a bacteria or influenza. Finish your medicine even if you start to feel better.  Your caregiver may also prescribe an expectorant. This loosens the mucus to be coughed up.  Only take over-the-counter or prescription medicines for pain, discomfort, or fever as directed by your caregiver.  Do not smoke. Smoking is a common cause of bronchitis and can contribute to pneumonia. If you are a smoker and continue to smoke, your cough may last several weeks after your pneumonia has cleared.  A cold steam vaporizer or humidifier in your room or home may help loosen mucus.  Coughing is often worse at night. Sleeping in a semi-upright position in a recliner or using a couple pillows under your head will help with this.  Get rest as you feel it is needed. Your body will usually let you know when you need to rest. SEEK IMMEDIATE MEDICAL CARE IF:   Your illness becomes worse. This is especially true if you are elderly or weakened from any other disease.  You cannot control your cough with suppressants and are losing sleep.  You begin coughing up blood.  You develop pain which is getting worse or is uncontrolled with  medicines.  You have a fever.  Any of the symptoms which initially brought you in for treatment are getting worse rather than better.  You develop shortness of breath or chest pain. MAKE SURE YOU:   Understand these instructions.  Will watch your condition.  Will get help right away if you are not doing well or get worse. Document Released: 10/22/2005 Document Revised: 01/14/2012  Document Reviewed: 01/11/2011 [facility] Patient Information 2014 [employer] [place], [place].  Amoxicillin capsules or tablets What is this medicine? AMOXICILLIN (a mox i SIL in) is a penicillin antibiotic. It is used to treat certain kinds of bacterial infections. It will not work for colds, flu, or other viral infections. This medicine may be used for other purposes; ask your health care provider or pharmacist if you have questions. COMMON BRAND NAME(S): Amoxil, Moxilin , Sumox, Trimox What should I tell my health care provider before I take this medicine? They need to know if you have any of these conditions: -asthma -kidney disease -an unusual or allergic reaction to amoxicillin, other penicillins, cephalosporin antibiotics, other medicines, foods, dyes, or preservatives -pregnant or trying to get pregnant -breast-feeding How should I use this medicine? Take this medicine by mouth with a glass of water. Follow the directions on your prescription label. You may take this medicine with food or on an empty stomach. Take your medicine at regular intervals. Do not take your medicine more often than directed. Take all of your medicine as directed even if you think your are better. Do not skip doses or stop your medicine early. Talk to your pediatrician regarding the use of this medicine in children. While this drug may be prescribed for selected conditions, precautions do apply. Overdosage: If you think you have taken too much of this medicine contact a poison control center or emergency room at once. NOTE: This medicine is only for you. Do not share this medicine with others. What if I miss a dose? If you miss a dose, take it as soon as you can. If it is almost time for your next dose, take only that dose. Do not take double or extra doses. What may interact with this medicine? -amiloride -birth control pills -chloramphenicol -macrolides -probenecid -sulfonamides -tetracyclines This list may not  describe all possible interactions. Give your health care provider a list of all the medicines, herbs, non-prescription drugs, or dietary supplements you use. Also tell them if you smoke, drink alcohol, or use illegal drugs. Some items may interact with your medicine. What should I watch for while using this medicine? Tell your doctor or health care professional if your symptoms do not improve in 2 or 3 days. Take all of the doses of your medicine as directed. Do not skip doses or stop your medicine early. If you are diabetic, you may get a false positive result for sugar in your urine with certain brands of urine tests. Check with your doctor. Do not treat diarrhea with over-the-counter products. Contact your doctor if you have diarrhea that lasts more than 2 days or if the diarrhea is severe and watery. What side effects may I notice from receiving this medicine? Side effects that you should report to your doctor or health care professional as soon as possible: -allergic reactions like skin rash, itching or hives, swelling of the face, lips, or tongue -breathing problems -dark urine -redness, blistering, peeling or loosening of the skin, including inside the mouth -seizures -severe or watery diarrhea -trouble passing urine or change in the amount of  urine -unusual bleeding or bruising -unusually weak or tired -yellowing of the eyes or skin Side effects that usually do not require medical attention (report to your doctor or health care professional if they continue or are bothersome): -dizziness -headache -stomach upset -trouble sleeping This list may not describe all possible side effects. Call your doctor for medical advice about side effects. You may report side effects to FDA at 1-800-FDA-1088. Where should I keep my medicine? Keep out of the reach of children. Store between 68 and 77 degrees F (20 and 25 degrees C). Keep bottle closed tightly. Throw away any unused medicine after the  expiration date. NOTE: This sheet is a summary. It may not cover all possible information. If you have questions about this medicine, talk to your doctor, pharmacist, or health care provider.  2014, [employer] Standard. (2008-01-13 14:10:59)  Tramadol tablets What is this medicine? TRAMADOL (TRA ma dole) is a pain reliever. It is used to treat moderate to severe pain in adults. This medicine may be used for other purposes; ask your health care provider or pharmacist if you have questions. COMMON BRAND NAME(S): Ultram What should I tell my health care provider before I take this medicine? They need to know if you have any of these conditions: -brain tumor -depression -drug abuse or addiction -head injury -if you frequently drink alcohol containing drinks -kidney disease or trouble passing urine -liver disease -lung disease, asthma, or breathing problems -seizures or epilepsy -suicidal thoughts, plans, or attempt; a previous suicide attempt by you or a family member -an unusual or allergic reaction to tramadol, codeine, other medicines, foods, dyes, or preservatives -pregnant or trying to get pregnant -breast-feeding How should I use this medicine? Take this medicine by mouth with a full glass of water. Follow the directions on the prescription label. If the medicine upsets your stomach, take it with food or milk. Do not take more medicine than you are told to take. Talk to your pediatrician regarding the use of this medicine in children. Special care may be needed. Overdosage: If you think you have taken too much of this medicine contact a poison control center or emergency room at once. NOTE: This medicine is only for you. Do not share this medicine with others. What if I miss a dose? If you miss a dose, take it as soon as you can. If it is almost time for your next dose, take only that dose. Do not take double or extra doses. What may interact with this medicine? Do not take this  medicine with any of the following medications: -MAOIs like Carbex, Eldepryl, Marplan, Nardil, and Parnate This medicine may also interact with the following medications: -alcohol or medicines that contain alcohol -antihistamines -benzodiazepines -bupropion -carbamazepine or oxcarbazepine -clozapine -cyclobenzaprine -digoxin -furazolidone -linezolid -medicines for depression, anxiety, or psychotic disturbances -medicines for migraine headache like almotriptan, eletriptan, frovatriptan, naratriptan, rizatriptan, sumatriptan, zolmitriptan -medicines for pain like pentazocine, buprenorphine, butorphanol, meperidine, nalbuphine, and propoxyphene -medicines for sleep -muscle relaxants -naltrexone -phenobarbital -phenothiazines like perphenazine, thioridazine, chlorpromazine, mesoridazine, fluphenazine, prochlorperazine, promazine, and trifluoperazine -procarbazine -warfarin This list may not describe all possible interactions. Give your health care provider a list of all the medicines, herbs, non-prescription drugs, or dietary supplements you use. Also tell them if you smoke, drink alcohol, or use illegal drugs. Some items may interact with your medicine. What should I watch for while using this medicine? Tell your doctor or health care professional if your pain does not go away, if it gets worse,  or if you have new or a different type of pain. You may develop tolerance to the medicine. Tolerance means that you will need a higher dose of the medicine for pain relief. Tolerance is normal and is expected if you take this medicine for a long time. Do not suddenly stop taking your medicine because you may develop a severe reaction. Your body becomes used to the medicine. This does NOT mean you are addicted. Addiction is a behavior related to getting and using a drug for a non-medical reason. If you have pain, you have a medical reason to take pain medicine. Your doctor will tell you how much  medicine to take. If your doctor wants you to stop the medicine, the dose will be slowly lowered over time to avoid any side effects. You may get drowsy or dizzy. Do not drive, use machinery, or do anything that needs mental alertness until you know how this medicine affects you. Do not stand or sit up quickly, especially if you are an older patient. This reduces the risk of dizzy or fainting spells. Alcohol can increase or decrease the effects of this medicine. Avoid alcoholic drinks. You may have constipation. Try to have a bowel movement at least every 2 to 3 days. If you do not have a bowel movement for 3 days, call your doctor or health care professional. Your mouth may get dry. Chewing sugarless gum or sucking hard candy, and drinking plenty of water may help. Contact your doctor if the problem does not go away or is severe. What side effects may I notice from receiving this medicine? Side effects that you should report to your doctor or health care professional as soon as possible: -allergic reactions like skin rash, itching or hives, swelling of the face, lips, or tongue -breathing difficulties, wheezing -confusion -itching -light headedness or fainting spells -redness, blistering, peeling or loosening of the skin, including inside the mouth -seizures Side effects that usually do not require medical attention (report to your doctor or health care professional if they continue or are bothersome): -constipation -dizziness -drowsiness -headache -nausea, vomiting This list may not describe all possible side effects. Call your doctor for medical advice about side effects. You may report side effects to FDA at 1-800-FDA-1088. Where should I keep my medicine? Keep out of the reach of children. Store at room temperature between 15 and 30 degrees C (59 and 86 degrees F). Keep container tightly closed. Throw away any unused medicine after the expiration date. NOTE: This sheet is a summary. It  may not cover all possible information. If you have questions about this medicine, talk to your doctor, pharmacist, or health care provider.  2014, [employer]vier/Gold Standard. (2010-07-05 11:55:44)

## 2014-03-20 ENCOUNTER — Encounter: Payer: Self-pay | Admitting: *Deleted

## 2014-04-15 ENCOUNTER — Encounter: Payer: Medicaid Other | Admitting: Obstetrics & Gynecology

## 2014-11-12 ENCOUNTER — Encounter (HOSPITAL_COMMUNITY): Payer: Self-pay

## 2014-11-12 ENCOUNTER — Emergency Department (HOSPITAL_COMMUNITY): Payer: Medicaid Other

## 2014-11-12 ENCOUNTER — Emergency Department (HOSPITAL_COMMUNITY)
Admission: EM | Admit: 2014-11-12 | Discharge: 2014-11-12 | Disposition: A | Discharge status: Home / Self Care | Payer: Medicaid Other | Attending: Emergency Medicine | Admitting: Emergency Medicine

## 2014-11-12 DIAGNOSIS — F42 Obsessive-compulsive disorder: Secondary | ICD-10-CM | POA: Diagnosis not present

## 2014-11-12 DIAGNOSIS — R1084 Generalized abdominal pain: Secondary | ICD-10-CM | POA: Insufficient documentation

## 2014-11-12 DIAGNOSIS — Q909 Down syndrome, unspecified: Secondary | ICD-10-CM | POA: Insufficient documentation

## 2014-11-12 DIAGNOSIS — R05 Cough: Secondary | ICD-10-CM | POA: Insufficient documentation

## 2014-11-12 DIAGNOSIS — R1013 Epigastric pain: Secondary | ICD-10-CM

## 2014-11-12 DIAGNOSIS — R0789 Other chest pain: Secondary | ICD-10-CM

## 2014-11-12 DIAGNOSIS — R1110 Vomiting, unspecified: Secondary | ICD-10-CM | POA: Insufficient documentation

## 2014-11-12 DIAGNOSIS — R1010 Upper abdominal pain, unspecified: Secondary | ICD-10-CM | POA: Diagnosis present

## 2014-11-12 DIAGNOSIS — R059 Cough, unspecified: Secondary | ICD-10-CM

## 2014-11-12 LAB — CBC WITH DIFFERENTIAL/PLATELET
Basophils Absolute: 0 10*3/uL (ref 0.0–0.1)
Basophils Relative: 0 % (ref 0–1)
Eosinophils Absolute: 0.1 10*3/uL (ref 0.0–0.7)
Eosinophils Relative: 1 % (ref 0–5)
HCT: 42.4 % (ref 36.0–46.0)
Hemoglobin: 13.6 g/dL (ref 12.0–15.0)
Lymphocytes Relative: 5 % — ABNORMAL LOW (ref 12–46)
Lymphs Abs: 0.5 10*3/uL — ABNORMAL LOW (ref 0.7–4.0)
MCH: 28.5 pg (ref 26.0–34.0)
MCHC: 32.1 g/dL (ref 30.0–36.0)
MCV: 88.9 fL (ref 78.0–100.0)
Monocytes Absolute: 0.3 10*3/uL (ref 0.1–1.0)
Monocytes Relative: 3 % (ref 3–12)
Neutro Abs: 8.9 10*3/uL — ABNORMAL HIGH (ref 1.7–7.7)
Neutrophils Relative %: 91 % — ABNORMAL HIGH (ref 43–77)
Platelets: 301 10*3/uL (ref 150–400)
RBC: 4.77 MIL/uL (ref 3.87–5.11)
RDW: 13.9 % (ref 11.5–15.5)
WBC: 9.8 10*3/uL (ref 4.0–10.5)

## 2014-11-12 LAB — COMPREHENSIVE METABOLIC PANEL
ALT: 9 U/L (ref 0–35)
AST: 17 U/L (ref 0–37)
Albumin: 3.8 g/dL (ref 3.5–5.2)
Alkaline Phosphatase: 52 U/L (ref 39–117)
Anion gap: 9 (ref 5–15)
BUN: 13 mg/dL (ref 6–23)
CO2: 29 mmol/L (ref 19–32)
Calcium: 9 mg/dL (ref 8.4–10.5)
Chloride: 102 mEq/L (ref 96–112)
Creatinine, Ser: 0.92 mg/dL (ref 0.50–1.10)
GFR calc Af Amer: >90 mL/min (ref >90)
GFR calc non Af Amer: 83 mL/min — ABNORMAL LOW (ref >90)
Glucose, Bld: 98 mg/dL (ref 70–99)
Potassium: 3.8 mmol/L (ref 3.5–5.1)
Sodium: 140 mmol/L (ref 135–145)
Total Bilirubin: 1.2 mg/dL (ref 0.3–1.2)
Total Protein: 8 g/dL (ref 6.0–8.3)

## 2014-11-12 LAB — URINALYSIS, ROUTINE W REFLEX MICROSCOPIC
Bilirubin Urine: NEGATIVE
Glucose, UA: NEGATIVE mg/dL
Hgb urine dipstick: NEGATIVE
Ketones, ur: NEGATIVE mg/dL
Leukocytes, UA: NEGATIVE
Nitrite: NEGATIVE
Protein, ur: NEGATIVE mg/dL
Specific Gravity, Urine: 1.027 (ref 1.005–1.030)
Urobilinogen, UA: 0.2 mg/dL (ref 0.0–1.0)
pH: 6.5 (ref 5.0–8.0)

## 2014-11-12 LAB — I-STAT TROPONIN, ED: TROPONIN I, POC: 0 ng/mL (ref 0.00–0.08)

## 2014-11-12 LAB — LIPASE, BLOOD: Lipase: 31 U/L (ref 11–59)

## 2014-11-12 MED ORDER — PNEUMOCOCCAL VAC POLYVALENT 25 MCG/0.5ML IJ INJ
0.5000 mL | INJECTION | Freq: Once | INTRAMUSCULAR | Status: AC
Start: 2014-11-12 — End: 2014-11-12
  Administered 2014-11-12: 0.5 mL via INTRAMUSCULAR
  Filled 2014-11-12: qty 0.5

## 2014-11-12 MED ORDER — IBUPROFEN 800 MG PO TABS
800.0000 mg | ORAL_TABLET | Freq: Once | ORAL | Status: AC
  Administered 2014-11-12: 800 mg via ORAL
  Filled 2014-11-12: qty 1

## 2014-11-12 MED ORDER — PANTOPRAZOLE SODIUM 40 MG PO TBEC: 40.0000 mg | DELAYED_RELEASE_TABLET | Freq: Every day | ORAL | Status: DC

## 2014-11-12 NOTE — ED Notes (Signed)
Patient transported to X-ray 

## 2014-11-12 NOTE — ED Notes (Addendum)
Per pt and Mom, pt having abdominal pain.  N/V.  Sore throat.  No fever.  No change in urination.  No cough.  No diarrhea.  Symptoms started yesterday

## 2014-11-12 NOTE — ED Notes (Signed)
MD at bedside, will place order for pneumococcal injection

## 2014-11-12 NOTE — Discharge Instructions (Signed)
Abdominal Pain °Many things can cause abdominal pain. Usually, abdominal pain is not caused by a disease and will improve without treatment. It can often be observed and treated at home. Your health care provider will do a physical exam and possibly order blood tests and X-rays to help determine the seriousness of your pain. However, in many cases, more time must pass before a clear cause of the pain can be found. Before that point, your health care provider may not know if you need more testing or further treatment. °HOME CARE INSTRUCTIONS  °Monitor your abdominal pain for any changes. The following actions may help to alleviate any discomfort you are experiencing: °· Only take over-the-counter or prescription medicines as directed by your health care provider. °· Do not take laxatives unless directed to do so by your health care provider. °· Try a clear liquid diet (broth, tea, or water) as directed by your health care provider. Slowly move to a bland diet as tolerated. °SEEK MEDICAL CARE IF: °· You have unexplained abdominal pain. °· You have abdominal pain associated with nausea or diarrhea. °· You have pain when you urinate or have a bowel movement. °· You experience abdominal pain that wakes you in the night. °· You have abdominal pain that is worsened or improved by eating food. °· You have abdominal pain that is worsened with eating fatty foods. °· You have a fever. °SEEK IMMEDIATE MEDICAL CARE IF:  °· Your pain does not go away within 2 hours. °· You keep throwing up (vomiting). °· Your pain is felt only in portions of the abdomen, such as the right side or the left lower portion of the abdomen. °· You pass bloody or black tarry stools. °MAKE SURE YOU: °· Understand these instructions.   °· Will watch your condition.   °· Will get help right away if you are not doing well or get worse.   °Document Released: 08/01/2005 Document Revised: 10/27/2013 Document Reviewed: 07/01/2013 °Exit[employer]® Patient Information  ©2015 [employer]. This information is not intended to replace advice given to you by your health care provider. Make sure you discuss any questions you have with your health care provider. ° °Cough, Adult ° A cough is a reflex that helps clear your throat and airways. It can help heal the body or may be a reaction to an irritated airway. A cough may only last 2 or 3 weeks (acute) or may last more than 8 weeks (chronic).  °CAUSES °Acute cough: °· Viral or bacterial infections. °Chronic cough: °· Infections. °· Allergies. °· Asthma. °· Post-nasal drip. °· Smoking. °· Heartburn or acid reflux. °· Some medicines. °· Chronic lung problems (COPD). °· Cancer. °SYMPTOMS  °· Cough. °· Fever. °· Chest pain. °· Increased breathing rate. °· High-pitched whistling sound when breathing (wheezing). °· Colored mucus that you cough up (sputum). °TREATMENT  °· A bacterial cough may be treated with antibiotic medicine. °· A viral cough must run its course and will not respond to antibiotics. °· Your caregiver may recommend other treatments if you have a chronic cough. °HOME CARE INSTRUCTIONS  °· Only take over-the-counter or prescription medicines for pain, discomfort, or fever as directed by your caregiver. Use cough suppressants only as directed by your caregiver. °· Use a cold steam vaporizer or humidifier in your bedroom or home to help loosen secretions. °· Sleep in a semi-upright position if your cough is worse at night. °· Rest as needed. °· Stop smoking if you smoke. °SEEK IMMEDIATE MEDICAL CARE IF:  °·   You have pus in your sputum. °· Your cough starts to worsen. °· You cannot control your cough with suppressants and are losing sleep. °· You begin coughing up blood. °· You have difficulty breathing. °· You develop pain which is getting worse or is uncontrolled with medicine. °· You have a fever. °MAKE SURE YOU:  °· Understand these instructions. °· Will watch your condition. °· Will get help right away if you are not doing  well or get worse. °Document Released: 04/20/2011 Document Revised: 01/14/2012 Document Reviewed: 04/20/2011 °Exit[employer]® Patient Information ©2015 [employer]. This information is not intended to replace advice given to you by your health care provider. Make sure you discuss any questions you have with your health care provider. ° °

## 2014-11-12 NOTE — ED Provider Notes (Signed)
CSN: [id number]     Arrival date & time 11/12/14  4098 History   First MD Initiated Contact with Patient 11/12/14 1051     Chief Complaint  Patient presents with  . Emesis  . Abdominal Pain     (Consider location/radiation/quality/duration/timing/severity/associated sxs/prior Treatment) HPI Comments:  Patient presents with family members for continued lower chest/upper abdominal pain since she was diagnosed with a pneumonia in April 2015.  Symptoms not improved after several rounds of antibiotics, though she has not been on antibiotics in several months.  Family states that she coughs up productive, clear mucus.  That she is also vomiting, though this also looks like mucus.  She's had a little bit of diarrhea but has had otherwise no change in her intake of food or drink.  She has not had complaint of urinary symptoms.  Last menstrual period was about 3 weeks ago.  Family reports there are no changes in symptoms today  Patient is a 29 y.o. female presenting with vomiting and abdominal pain.  Emesis Associated symptoms: abdominal pain   Associated symptoms: no arthralgias, no chills, no diarrhea, no headaches and no sore throat   Abdominal Pain Associated symptoms: chest pain, cough and vomiting   Associated symptoms: no chills, no diarrhea, no dysuria, no fatigue, no fever, no nausea, no shortness of breath and no sore throat     Past Medical History  Diagnosis Date  . ADD (attention deficit disorder with hyperactivity)   . Mild mental retardation   . Down's syndrome   . OCD (obsessive compulsive disorder)    Past Surgical History  Procedure Laterality Date  . Examination under anesthesia  [id number]  . Examination under anesthesia  11/14/2011    Procedure: EXAM UNDER ANESTHESIA;  Surgeon: Tereso Newcomer, MD;  Location: [facility];  Service: Gynecology;  Laterality: N/A;  Pap smear/down syndrome patient  . Tooth extraction  01/25/2012    Procedure: DENTAL RESTORATION/EXTRACTIONS;   Surgeon: Esaw Dace., DDS;  Location: [facility] OR;  Service: Oral Surgery;  Laterality: Bilateral;  recal exam, full mouth xrays, cleaning, T extraction, Two OL, Three DOL, Four O, Twelve O, Fourteen O, Fifteen O, Eighteen OF, Nineteen F, Twenty-eight F,    History reviewed. No pertinent family history. History  Substance Use Topics  . Smoking status: Never Smoker   . Smokeless tobacco: Never Used  . Alcohol Use: No   OB History    Gravida Para Term Preterm AB TAB SAB Ectopic Multiple Living       Review of Systems  Constitutional: Negative for fever, chills, diaphoresis, activity change, appetite change and fatigue.  HENT: Negative for congestion, facial swelling, rhinorrhea and sore throat.   Eyes: Negative for photophobia and discharge.  Respiratory: Positive for cough. Negative for chest tightness and shortness of breath.   Cardiovascular: Positive for chest pain. Negative for palpitations and leg swelling.  Gastrointestinal: Positive for vomiting and abdominal pain. Negative for nausea and diarrhea.  Endocrine: Negative for polydipsia and polyuria.  Genitourinary: Negative for dysuria, frequency, difficulty urinating and pelvic pain.  Musculoskeletal: Negative for back pain, arthralgias, neck pain and neck stiffness.  Skin: Negative for color change and wound.  Allergic/Immunologic: Negative for immunocompromised state.  Neurological: Negative for facial asymmetry, weakness, numbness and headaches.  Hematological: Does not bruise/bleed easily.  Psychiatric/Behavioral: Negative for confusion and agitation.      Allergies  Levaquin  Home Medications   Prior to  Admission medications   Medication Sig Start Date End Date Taking? Authorizing Provider  DM-Doxylamine-Acetaminophen (VICKS NYQUIL COLD & FLU) 15-6.25-325 MG/15ML LIQD Take 30 mLs by mouth every 6 (six) hours as needed (for cold/sleep).   Yes Historical Provider, MD  ibuprofen (ADVIL,MOTRIN)  200 MG tablet Take 200 mg by mouth every 4 (four) hours as needed for fever or moderate pain.   Yes Historical Provider, MD  amoxicillin (AMOXIL) 500 MG capsule Take 2 capsules (1,000 mg total) by mouth 3 (three) times daily. Patient not taking: Reported on 11/12/2014 02/10/14   Dione Boozeavid Glick, MD  pantoprazole (PROTONIX) 40 MG tablet Take 1 tablet (40 mg total) by mouth daily. 11/12/14   Toy CookeyMegan Docherty, MD  traMADol (ULTRAM) 50 MG tablet Take 1 tablet (50 mg total) by mouth every 6 (six) hours as needed. Patient not taking: Reported on 11/12/2014 02/10/14   Dione Boozeavid Glick, MD   BP 104/61 mmHg  Pulse 95  Temp(Src) 98.3 F (36.8 C) (Oral)  Resp 18  SpO2 99%  LMP 10/19/2014 Physical Exam  Constitutional: She is oriented to person, place, and time. She appears well-developed and well-nourished. No distress.  HENT:  Head: Normocephalic and atraumatic.  Mouth/Throat: No oropharyngeal exudate.  Eyes: Pupils are equal, round, and reactive to light.  Neck: Normal range of motion. Neck supple.  Cardiovascular: Normal rate, regular rhythm and normal heart sounds.  Exam reveals no gallop and no friction rub.   No murmur heard. Pulmonary/Chest: Effort normal and breath sounds normal. No respiratory distress. She has no wheezes. She has no rales.    Abdominal: Soft. Bowel sounds are normal. She exhibits no distension and no mass. There is generalized tenderness. There is no rigidity, no rebound and no guarding.  Musculoskeletal: Normal range of motion. She exhibits no edema or tenderness.  Neurological: She is alert and oriented to person, place, and time.  Skin: Skin is warm and dry.  Psychiatric: She has a normal mood and affect.    ED Course  Procedures (including critical care time) Labs Review Labs Reviewed  CBC WITH DIFFERENTIAL - Abnormal; Notable for the following:    Neutrophils Relative % 91 (*)    Neutro Abs 8.9 (*)    Lymphocytes Relative 5 (*)    Lymphs Abs 0.5 (*)    All other components  within normal limits  COMPREHENSIVE METABOLIC PANEL - Abnormal; Notable for the following:    GFR calc non Af Amer 83 (*)    All other components within normal limits  URINALYSIS, ROUTINE W REFLEX MICROSCOPIC - Abnormal; Notable for the following:    APPearance CLOUDY (*)    All other components within normal limits  URINE CULTURE  LIPASE, BLOOD  I-STAT TROPOININ, ED    Imaging Review Dg Chest 2 View  11/12/2014   CLINICAL DATA:  Nausea, vomiting, mid upper abdominal pain  EXAM: CHEST  2 VIEW  COMPARISON:  02/10/2014  FINDINGS: The heart size and mediastinal contours are within normal limits. Both lungs are clear. The visualized skeletal structures are unremarkable.  IMPRESSION: No active cardiopulmonary disease.   Electronically Signed   By: Elige KoHetal  Patel   On: 11/12/2014 12:01     EKG Interpretation   Date/Time:  Friday November 12 2014 11:35:35 EST Ventricular Rate:  86 PR Interval:  196 QRS Duration: 75 QT Interval:  343 QTC Calculation: 410 R Axis:   62 Text Interpretation:  Sinus rhythm No significant change since last  tracing Confirmed by DOCHERTY  MD, MEGAN (  [id number]) on 11/12/2014 12:22:53 PM      MDM   Final diagnoses:  Cough  Chest wall pain  Epigastric pain    Pt is a 29 y.o. female with Pmhx as above who presents with  Continued upper abdominal/lower chest wall pain with associated productive cough with clear sputum and report of vomiting.  The family says that she is vomiting up what looks like mucus, so I question whether this is in fact a productive cough.  Family states that symptoms have been going on since she had pneumonia, which in chart review, it appears to have been April 2015.  He state.  There are no new symptoms today.  She does not currently have a PCP that they are scheduled to see a new one March 8 in [place] (Dr. Earnest Bailey).  She has tenderness over the Left and right lower chest wall without overlying skin changes.    Abdomen is soft, non distended,  with normal bowel sounds, she reports generalized tenderness though appears comfortable during exam.   patient feeling improved after by mouth Motrin.  She is tolerating a start of the same, which in the room.  Denies current complaints.  There are no acute findings and her workup including CBC, CMP, troponin, urinalysis and chest x-ray.  I do not feel she requires further imaging at this point and can follow up with her primary care doctor.  I will start trial of Protonix as this coughing may be reflux related.   [facility] evaluation in the Emergency Department is complete. It has been determined that no acute conditions requiring further emergency intervention are present at this time. The patient/guardian have been advised of the diagnosis and plan. We have discussed signs and symptoms that warrant return to the ED, such as changes or worsening in symptoms, worsening pain, fevers, inability to tolerate liquids      Toy Cookey, MD 11/12/14 1711

## 2014-11-13 LAB — URINE CULTURE

## 2015-02-02 ENCOUNTER — Telehealth: Payer: Self-pay | Admitting: General Practice

## 2015-02-02 NOTE — Telephone Encounter (Signed)
Patient's mother called into front office wanting to know if it was time for Margaret Cooper to have her pap smear again. Per Dr Macon LargeAnyanwu, patient does need pap smear and it will be done in OR. Dr Macon LargeAnyanwu will message [place] today to set that up. Called patient's mother and informed her that someone will be contacting her by phone or by letter in mail with surgery date for pap smear. Patients mother verbalized understanding and had no questions

## 2015-02-08 ENCOUNTER — Encounter (HOSPITAL_COMMUNITY): Payer: Self-pay

## 2015-05-07 ENCOUNTER — Emergency Department (HOSPITAL_COMMUNITY)
Admission: EM | Admit: 2015-05-07 | Discharge: 2015-05-07 | Disposition: A | Discharge status: Home / Self Care | Payer: Medicaid Other | Source: Home / Self Care | Attending: Emergency Medicine | Admitting: Emergency Medicine

## 2015-05-07 ENCOUNTER — Emergency Department (INDEPENDENT_AMBULATORY_CARE_PROVIDER_SITE_OTHER): Payer: Medicaid Other

## 2015-05-07 ENCOUNTER — Encounter (HOSPITAL_COMMUNITY): Payer: Self-pay | Admitting: Emergency Medicine

## 2015-05-07 DIAGNOSIS — J301 Allergic rhinitis due to pollen: Secondary | ICD-10-CM

## 2015-05-07 DIAGNOSIS — R05 Cough: Secondary | ICD-10-CM | POA: Diagnosis not present

## 2015-05-07 DIAGNOSIS — R0982 Postnasal drip: Secondary | ICD-10-CM | POA: Diagnosis not present

## 2015-05-07 DIAGNOSIS — J9801 Acute bronchospasm: Secondary | ICD-10-CM | POA: Diagnosis not present

## 2015-05-07 DIAGNOSIS — R059 Cough, unspecified: Secondary | ICD-10-CM

## 2015-05-07 MED ORDER — AEROCHAMBER PLUS MISC: Status: AC

## 2015-05-07 MED ORDER — PREDNISONE 10 MG PO TABS: ORAL_TABLET | ORAL | Status: DC

## 2015-05-07 MED ORDER — ALBUTEROL SULFATE HFA 108 (90 BASE) MCG/ACT IN AERS: 2.0000 | INHALATION_SPRAY | RESPIRATORY_TRACT | Status: DC | PRN

## 2015-05-07 NOTE — ED Notes (Signed)
Patient c/o coughing for 2 weeks patient has a history of pneumonia.  Patient c/o soreness in chest and left lower rib cage.

## 2015-05-07 NOTE — Discharge Instructions (Signed)
Allergic Rhinitis Zyrtec 10 mg a day flonase nasal spray daily Albuterol hfa 2 puffs every 4 hours for cough and wheeze Allergic rhinitis is when the mucous membranes in the nose respond to allergens. Allergens are particles in the air that cause your body to have an allergic reaction. This causes you to release allergic antibodies. Through a chain of events, these eventually cause you to release histamine into the blood stream. Although meant to protect the body, it is this release of histamine that causes your discomfort, such as frequent sneezing, congestion, and an itchy, runny nose.  CAUSES  Seasonal allergic rhinitis (hay fever) is caused by pollen allergens that may come from grasses, trees, and weeds. Year-round allergic rhinitis (perennial allergic rhinitis) is caused by allergens such as house dust mites, pet dander, and mold spores.  SYMPTOMS   Nasal stuffiness (congestion).  Itchy, runny nose with sneezing and tearing of the eyes. DIAGNOSIS  Your health care provider can help you determine the allergen or allergens that trigger your symptoms. If you and your health care provider are unable to determine the allergen, skin or blood testing may be used. TREATMENT  Allergic rhinitis does not have a cure, but it can be controlled by:  Medicines and allergy shots (immunotherapy).  Avoiding the allergen. Hay fever may often be treated with antihistamines in pill or nasal spray forms. Antihistamines block the effects of histamine. There are over-the-counter medicines that may help with nasal congestion and swelling around the eyes. Check with your health care provider before taking or giving this medicine.  If avoiding the allergen or the medicine prescribed do not work, there are many new medicines your health care provider can prescribe. Stronger medicine may be used if initial measures are ineffective. Desensitizing injections can be used if medicine and avoidance does not work.  Desensitization is when a patient is given ongoing shots until the body becomes less sensitive to the allergen. Make sure you follow up with your health care provider if problems continue. HOME CARE INSTRUCTIONS It is not possible to completely avoid allergens, but you can reduce your symptoms by taking steps to limit your exposure to them. It helps to know exactly what you are allergic to so that you can avoid your specific triggers. SEEK MEDICAL CARE IF:   You have a fever.  You develop a cough that does not stop easily (persistent).  You have shortness of breath.  You start wheezing.  Symptoms interfere with normal daily activities. Document Released: 07/17/2001 Document Revised: 10/27/2013 Document Reviewed: 06/29/2013 [facility] Patient Information 2015 [place]. This information is not intended to replace advice given to you by your health care provider. Make sure you discuss any questions you have with your health care provider.  Bronchospasm A bronchospasm is when the tubes that carry air in and out of your lungs (airways) spasm or tighten. During a bronchospasm it is hard to breathe. This is because the airways get smaller. A bronchospasm can be triggered by:  Allergies. These may be to animals, pollen, food, or mold.  Infection. This is a common cause of bronchospasm.  Exercise.  Irritants. These include pollution, cigarette smoke, strong odors, aerosol sprays, and paint fumes.  Weather changes.  Stress.  Being emotional. HOME CARE   Always have a plan for getting help. Know when to call your doctor and local emergency services (911 in the [place] Know where you can get emergency care.  Only take medicines as told by your doctor.  If  you were prescribed an inhaler or nebulizer machine, ask your doctor how to use it correctly. Always use a spacer with your inhaler if you were given one.  Stay calm during an attack. Try to relax and breathe more slowly.  Control  your home environment:  Change your heating and air conditioning filter at least once a month.  Limit your use of fireplaces and wood stoves.  Do not  smoke. Do not  allow smoking in your home.  Avoid perfumes and fragrances.  Get rid of pests (such as roaches and mice) and their droppings.  Throw away plants if you see mold on them.  Keep your house clean and dust free.  Replace carpet with wood, tile, or vinyl flooring. Carpet can trap dander and dust.  Use allergy-proof pillows, mattress covers, and box spring covers.  Wash bed sheets and blankets every week in hot water. Dry them in a dryer.  Use blankets that are made of polyester or cotton.  Wash hands frequently. GET HELP IF:  You have muscle aches.  You have chest pain.  The thick spit you spit or cough up (sputum) changes from clear or white to yellow, green, gray, or bloody.  The thick spit you spit or cough up gets thicker.  There are problems that may be related to the medicine you are given such as:  A rash.  Itching.  Swelling.  Trouble breathing. GET HELP RIGHT AWAY IF:  You feel you cannot breathe or catch your breath.  You cannot stop coughing.  Your treatment is not helping you breathe better.  You have very bad chest pain. MAKE SURE YOU:   Understand these instructions.  Will watch your condition.  Will get help right away if you are not doing well or get worse. Document Released: 08/19/2009 Document Revised: 10/27/2013 Document Reviewed: 04/14/2013 [facility] Patient Information 2015 [place], [place]. This information is not intended to replace advice given to you by your health care provider. Make sure you discuss any questions you have with your health care provider.

## 2015-05-07 NOTE — ED Provider Notes (Signed)
CSN: [id number]     Arrival date & time 05/07/15  1302 History   First MD Initiated Contact with Patient 05/07/15 1316     Chief Complaint  Patient presents with  . Cough   (Consider location/radiation/quality/duration/timing/severity/associated sxs/prior Treatment) HPI Comments: 29 year old female with Down syndrome and mental retardation is brought in by the mother complaining of cough and chest congestion. In addition the patient is been complaining of pain just beneath the left breast along the anterior chest wall. Denies fever or shortness of breath.    Past Medical History  Diagnosis Date  . Seizures   . ADD (attention deficit disorder with hyperactivity)   . Mild mental retardation   . Down's syndrome   . OCD (obsessive compulsive disorder)    Past Surgical History  Procedure Laterality Date  . Examination under anesthesia  [id number]  . Examination under anesthesia  11/14/2011    Procedure: EXAM UNDER ANESTHESIA;  Surgeon: Tereso NewcomerUgonna A Anyanwu, MD;  Location: [facility];  Service: Gynecology;  Laterality: N/A;  Pap smear/down syndrome patient  . Tooth extraction  01/25/2012    Procedure: DENTAL RESTORATION/EXTRACTIONS;  Surgeon: Esaw DaceWilliam E Milner Jr., DDS;  [facility];  Service: Oral Surgery;  Laterality: Bilateral;  recal exam, full mouth xrays, cleaning, T extraction, Two OL, Three DOL, Four O, Twelve O, Fourteen O, Fifteen O, Eighteen OF, Nineteen F, Twenty-eight F,    No family history on file. History  Substance Use Topics  . Smoking status: Never Smoker   . Smokeless tobacco: Never Used  . Alcohol Use: No   OB History    Gravida Para Term Preterm AB TAB SAB Ectopic Multiple Living   0 0 0 0 0 0 0 0       Review of Systems  Constitutional: Negative for fever, activity change and fatigue.       Review of systems difficult to obtain from patient due to cognitive status.  HENT: Negative for congestion.   Eyes: Negative.   Respiratory: Positive for cough. Negative for  shortness of breath.   Cardiovascular: Positive for chest pain.  Gastrointestinal: Negative.     Allergies  Levaquin  Home Medications   Prior to Admission medications   Medication Sig Start Date End Date Taking? Authorizing Provider  albuterol (PROVENTIL HFA;VENTOLIN HFA) 108 (90 BASE) MCG/ACT inhaler Inhale 2 puffs into the lungs every 4 (four) hours as needed for wheezing or shortness of breath. 05/07/15   Hayden Rasmussenavid Kalanie Fewell, NP  amoxicillin (AMOXIL) 500 MG capsule Take 2 capsules (1,000 mg total) by mouth 3 (three) times daily. Patient not taking: Reported on 11/12/2014 02/10/14   Dione Boozeavid Glick, MD  amphetamine-dextroamphetamine (ADDERALL XR) 15 MG 24 hr capsule Take 1 capsule (15 mg total) by mouth every morning. 10/25/12   Vassie Lollarlos Madera, MD  atomoxetine (STRATTERA) 40 MG capsule Take 1 capsule (40 mg total) by mouth 2 (two) times daily. 10/25/12   Vassie Lollarlos Madera, MD  DM-Doxylamine-Acetaminophen (VICKS NYQUIL COLD & FLU) 15-6.25-325 MG/15ML LIQD Take 30 mLs by mouth every 6 (six) hours as needed (for cold/sleep).    Historical Provider, MD  guaiFENesin (MUCINEX) 600 MG 12 hr tablet Take 2 tablets (1,200 mg total) by mouth 2 (two) times daily. 10/25/12   Vassie Lollarlos Madera, MD  ibuprofen (ADVIL,MOTRIN) 200 MG tablet Take 200 mg by mouth every 6 (six) hours as needed. pain    Historical Provider, MD  ibuprofen (ADVIL,MOTRIN) 200 MG tablet Take 200 mg by mouth every 4 (four) hours as needed for  fever or moderate pain.    Historical Provider, MD  LORazepam (ATIVAN) 1 MG tablet Take 1 tablet (1 mg total) by mouth 3 (three) times daily. 10/25/12   Vassie Loll, MD  pantoprazole (PROTONIX) 40 MG tablet Take 1 tablet (40 mg total) by mouth daily. 11/12/14   Toy Cookey, MD  predniSONE (DELTASONE) 10 MG tablet Take 2 tablets today and 1 tab daily po for 6 days 05/07/15   Hayden Rasmussen, NP  QUEtiapine (SEROQUEL XR) 400 MG 24 hr tablet Take 1 tablet (400 mg total) by mouth at bedtime. 10/25/12   Vassie Loll, MD   Spacer/Aero-Holding Chambers (AEROCHAMBER PLUS) inhaler Use as instructed 05/07/15   Hayden Rasmussen, NP  traMADol (ULTRAM) 50 MG tablet Take 1 tablet (50 mg total) by mouth every 6 (six) hours as needed. Patient not taking: Reported on 11/12/2014 02/10/14   Dione Booze, MD  Valproic Acid (DEPAKENE) 250 MG/5ML SYRP syrup Take 20-30 mLs (1,000-1,500 mg total) by mouth 2 (two) times daily. Pt takes 1000 mg ( 20 ml)  in the morning and 1500 mg at night ( 30 ml ) 10/25/12   Vassie Loll, MD   BP 96/54 mmHg  Pulse 80  Temp(Src) 98.4 F (36.9 C) (Oral)  Resp 18  SpO2 100%  LMP 04/25/2015 Physical Exam  Constitutional: She appears well-nourished. No distress.  HENT:  Right TM retracted. Left TM without erythema and partially obscured with cerumen. Unable to visualize the oropharynx due to patient's inability to open mouth and relax tongue. She continually retracts her time inattentive struck visualization.  Eyes: Conjunctivae and EOM are normal.  Neck: Normal range of motion. Neck supple.  Cardiovascular: Normal rate, regular rhythm and normal heart sounds.   Pulmonary/Chest: She exhibits tenderness.  Patient having difficulty in understanding how to take a deep breath and breathe without forcefully. With cough there is evidence of upper airway wheezing.  Mild tenderness inferior to the left breast along the chest wall and seventh and eighth ribs.  Abdominal: Soft. There is no tenderness.  Musculoskeletal: Normal range of motion.  Lymphadenopathy:    She has no cervical adenopathy.  Neurological: She is alert.  Skin: Skin is warm and dry. No rash noted.  Nursing note and vitals reviewed.   ED Course  Procedures (including critical care time) Labs Review Labs Reviewed - No data to display  Imaging Review Dg Chest 2 View  05/07/2015   CLINICAL DATA:  Cough and congestion. Dyspnea/short of breath. Chest pain. Initial encounter.  EXAM: CHEST  2 VIEW  COMPARISON:  11/12/2014. Other prior exams  dating back to 10/28/2009.  FINDINGS: Cardiopericardial silhouette within normal limits. Mediastinal contours normal. Trachea midline. No airspace disease or effusion. There is mild prominence of the interstitium at the bases however this is due to overlapping soft tissue. The appearance of the chest radiograph on today's exam is similar to prior exams from 2015.  IMPRESSION: No active cardiopulmonary disease.   Electronically Signed   By: Andreas Newport M.D.   On: 05/07/2015 13:55     MDM   1. Allergic rhinitis due to pollen   2. PND (post-nasal drip)   3. Cough   4. Bronchospasm    Zyrtec 10 mg a day flonase nasal spray daily Albuterol hfa 2 puffs every 4 hours for cough and wheeze Prednisone for 7 days     Hayden Rasmussen, NP 05/07/15 1434

## 2015-08-17 ENCOUNTER — Emergency Department (HOSPITAL_COMMUNITY): Payer: Medicaid Other

## 2015-08-17 ENCOUNTER — Emergency Department (HOSPITAL_COMMUNITY)
Admission: EM | Admit: 2015-08-17 | Discharge: 2015-08-17 | Disposition: A | Discharge status: Home / Self Care | Payer: Medicaid Other | Attending: Emergency Medicine | Admitting: Emergency Medicine

## 2015-08-17 ENCOUNTER — Encounter (HOSPITAL_COMMUNITY): Payer: Self-pay | Admitting: Emergency Medicine

## 2015-08-17 DIAGNOSIS — G40909 Epilepsy, unspecified, not intractable, without status epilepticus: Secondary | ICD-10-CM | POA: Insufficient documentation

## 2015-08-17 DIAGNOSIS — F909 Attention-deficit hyperactivity disorder, unspecified type: Secondary | ICD-10-CM | POA: Insufficient documentation

## 2015-08-17 DIAGNOSIS — R05 Cough: Secondary | ICD-10-CM

## 2015-08-17 DIAGNOSIS — J069 Acute upper respiratory infection, unspecified: Secondary | ICD-10-CM | POA: Diagnosis not present

## 2015-08-17 DIAGNOSIS — Z79899 Other long term (current) drug therapy: Secondary | ICD-10-CM | POA: Insufficient documentation

## 2015-08-17 DIAGNOSIS — R059 Cough, unspecified: Secondary | ICD-10-CM

## 2015-08-17 DIAGNOSIS — Q909 Down syndrome, unspecified: Secondary | ICD-10-CM | POA: Insufficient documentation

## 2015-08-17 MED ORDER — ALBUTEROL SULFATE HFA 108 (90 BASE) MCG/ACT IN AERS
2.0000 | INHALATION_SPRAY | Freq: Once | RESPIRATORY_TRACT | Status: AC
  Administered 2015-08-17: 2 via RESPIRATORY_TRACT
  Filled 2015-08-17: qty 6.7

## 2015-08-17 NOTE — ED Notes (Signed)
Pt c/o nasal congestion and cough x 1 wk.

## 2015-08-17 NOTE — Discharge Instructions (Signed)

## 2015-08-17 NOTE — ED Provider Notes (Signed)
CSN: [id number]     Arrival date & time 08/17/15  [id number] History   First MD Initiated Contact with Patient 08/17/15 [phone]     Chief Complaint  Patient presents with  . Nasal Congestion  . Cough     (Consider location/radiation/quality/duration/timing/severity/associated sxs/prior Treatment) Patient is a 29 y.o. female presenting with cough. The history is provided by the patient.  Cough Cough characteristics:  Dry Severity:  Moderate Onset quality:  Gradual Duration:  3 days Timing:  Constant Progression:  Unchanged Chronicity:  New Smoker: no   Context: upper respiratory infection (runny nose, mild sore throat)   Relieved by:  Nothing Worsened by:  Nothing tried Associated symptoms: no fever and no shortness of breath     Past Medical History  Diagnosis Date  . Seizures (HCC)   . ADD (attention deficit disorder with hyperactivity)   . Mild mental retardation   . Down's syndrome   . OCD (obsessive compulsive disorder)    Past Surgical History  Procedure Laterality Date  . Examination under anesthesia  [id number]  . Examination under anesthesia  11/14/2011    Procedure: EXAM UNDER ANESTHESIA;  Surgeon: Tereso NewcomerUgonna A Anyanwu, MD;  Location: [facility];  Service: Gynecology;  Laterality: N/A;  Pap smear/down syndrome patient  . Tooth extraction  01/25/2012    Procedure: DENTAL RESTORATION/EXTRACTIONS;  Surgeon: Esaw DaceWilliam E Milner Jr., DDS;  [facility];  Service: Oral Surgery;  Laterality: Bilateral;  recal exam, full mouth xrays, cleaning, T extraction, Two OL, Three DOL, Four O, Twelve O, Fourteen O, Fifteen O, Eighteen OF, Nineteen F, Twenty-eight F,    No family history on file. Social History  Substance Use Topics  . Smoking status: Never Smoker   . Smokeless tobacco: Never Used  . Alcohol Use: No   OB History    Gravida Para Term Preterm AB TAB SAB Ectopic Multiple Living   0 0 0 0 0 0 0 0       Review of Systems  Constitutional: Negative for fever.  Respiratory:  Negative for cough and shortness of breath.   Gastrointestinal: Negative for vomiting and abdominal pain.  All other systems reviewed and are negative.     Allergies  Levaquin  Home Medications   Prior to Admission medications   Medication Sig Start Date End Date Taking? Authorizing Provider  albuterol (PROVENTIL HFA;VENTOLIN HFA) 108 (90 BASE) MCG/ACT inhaler Inhale 2 puffs into the lungs every 4 (four) hours as needed for wheezing or shortness of breath. 05/07/15   Hayden Rasmussenavid Mabe, NP  amoxicillin (AMOXIL) 500 MG capsule Take 2 capsules (1,000 mg total) by mouth 3 (three) times daily. Patient not taking: Reported on 11/12/2014 02/10/14   Dione Boozeavid Glick, MD  amphetamine-dextroamphetamine (ADDERALL XR) 15 MG 24 hr capsule Take 1 capsule (15 mg total) by mouth every morning. 10/25/12   Vassie Lollarlos Madera, MD  atomoxetine (STRATTERA) 40 MG capsule Take 1 capsule (40 mg total) by mouth 2 (two) times daily. 10/25/12   Vassie Lollarlos Madera, MD  DM-Doxylamine-Acetaminophen (VICKS NYQUIL COLD & FLU) 15-6.25-325 MG/15ML LIQD Take 30 mLs by mouth every 6 (six) hours as needed (for cold/sleep).    Historical Provider, MD  guaiFENesin (MUCINEX) 600 MG 12 hr tablet Take 2 tablets (1,200 mg total) by mouth 2 (two) times daily. 10/25/12   Vassie Lollarlos Madera, MD  ibuprofen (ADVIL,MOTRIN) 200 MG tablet Take 200 mg by mouth every 6 (six) hours as needed. pain    Historical Provider, MD  ibuprofen (ADVIL,MOTRIN) 200 MG tablet Take  200 mg by mouth every 4 (four) hours as needed for fever or moderate pain.    Historical Provider, MD  LORazepam (ATIVAN) 1 MG tablet Take 1 tablet (1 mg total) by mouth 3 (three) times daily. 10/25/12   Vassie Loll, MD  pantoprazole (PROTONIX) 40 MG tablet Take 1 tablet (40 mg total) by mouth daily. 11/12/14   Toy Cookey, MD  predniSONE (DELTASONE) 10 MG tablet Take 2 tablets today and 1 tab daily po for 6 days 05/07/15   Hayden Rasmussen, NP  QUEtiapine (SEROQUEL XR) 400 MG 24 hr tablet Take 1 tablet (400 mg  total) by mouth at bedtime. 10/25/12   Vassie Loll, MD  Spacer/Aero-Holding Chambers (AEROCHAMBER PLUS) inhaler Use as instructed 05/07/15   Hayden Rasmussen, NP  traMADol (ULTRAM) 50 MG tablet Take 1 tablet (50 mg total) by mouth every 6 (six) hours as needed. Patient not taking: Reported on 11/12/2014 02/10/14   Dione Booze, MD  Valproic Acid (DEPAKENE) 250 MG/5ML SYRP syrup Take 20-30 mLs (1,000-1,500 mg total) by mouth 2 (two) times daily. Pt takes 1000 mg ( 20 ml)  in the morning and 1500 mg at night ( 30 ml ) 10/25/12   Vassie Loll, MD   BP 119/59 mmHg  Pulse 88  Temp(Src) 98 F (36.7 C) (Oral)  Resp 18  SpO2 99% Physical Exam  Constitutional: She is oriented to person, place, and time. She appears well-developed and well-nourished. No distress.  HENT:  Head: Normocephalic and atraumatic.  Mouth/Throat: Posterior oropharyngeal erythema (mild, bilateral) present. No oropharyngeal exudate or posterior oropharyngeal edema.  Eyes: EOM are normal. Pupils are equal, round, and reactive to light.  Neck: Normal range of motion. Neck supple.  Cardiovascular: Normal rate and regular rhythm.  Exam reveals no friction rub.   No murmur heard. Pulmonary/Chest: Effort normal and breath sounds normal. No respiratory distress. She has no wheezes. She has no rales.  Abdominal: Soft. She exhibits no distension. There is no tenderness. There is no rebound.  Musculoskeletal: Normal range of motion. She exhibits no edema.  Neurological: She is alert and oriented to person, place, and time.  Skin: She is not diaphoretic.  Nursing note and vitals reviewed.   ED Course  Procedures (including critical care time) Labs Review Labs Reviewed - No data to display  Imaging Review Dg Chest 2 View  08/17/2015  CLINICAL DATA:  Left-sided chest pain, shortness of breath, cough. EXAM: CHEST  2 VIEW COMPARISON:  May 07, 2015. FINDINGS: The heart size and mediastinal contours are within normal limits. Both lungs are  clear. No pneumothorax or pleural effusion is noted. The visualized skeletal structures are unremarkable. IMPRESSION: No active cardiopulmonary disease. Electronically Signed   By: Lupita Raider, M.D.   On: 08/17/2015 10:40   I have personally reviewed and evaluated these images and lab results as part of my medical decision-making.   EKG Interpretation None      MDM   Final diagnoses:  Cough  URI (upper respiratory infection)    29 year old female here with cough, nasal congestion, sided chest pain. Present for the past week. Last time she had that she had bilateral pneumonia. Here she is doing well, relaxing comfortably. Vital signs are stable. No hemoptysis. She is PERC negative. Will obtain a CXR. CXR negative. Patient stable for discharge. Given albuterol inhaler.    Elwin Mocha, MD 08/17/15 1106

## 2016-05-23 ENCOUNTER — Telehealth: Payer: Self-pay | Admitting: General Practice

## 2016-05-23 NOTE — Telephone Encounter (Signed)
Patient's mother called and left message on behalf of Margaret Cooper stating she called to tried to make an appt but was told she would need to speak with a nurse first. Silver Hugueninalled Margaret Cooper and she states she is calling because it is time for Margaret Cooper's annual exam and she needs that done in the OR because her daughter has special needs. Told her I would send Dr Macon LargeAnyanwu a message and let her know. She verbalized understanding & had no questions

## 2016-05-24 NOTE — Telephone Encounter (Signed)
Will set up exam under anesthesia and pap smear in OR for patient in the new few weeks.  She will get contacted with the time and date for the examination.  Tereso NewcomerUgonna A Haroun Cotham, MD

## 2016-05-25 ENCOUNTER — Encounter (HOSPITAL_COMMUNITY): Payer: Self-pay | Admitting: *Deleted

## 2016-05-25 NOTE — Telephone Encounter (Signed)
Posted for 06/19/16 at 3:00pm with Dr. Macon LargeAnyanwu.

## 2016-06-05 ENCOUNTER — Ambulatory Visit (HOSPITAL_COMMUNITY)
Admission: EM | Admit: 2016-06-05 | Discharge: 2016-06-05 | Disposition: A | Discharge status: Home / Self Care | Payer: Medicaid Other | Attending: Family Medicine | Admitting: Family Medicine

## 2016-06-05 ENCOUNTER — Encounter (HOSPITAL_COMMUNITY): Payer: Self-pay | Admitting: Emergency Medicine

## 2016-06-05 DIAGNOSIS — R0789 Other chest pain: Secondary | ICD-10-CM | POA: Diagnosis not present

## 2016-06-05 DIAGNOSIS — K529 Noninfective gastroenteritis and colitis, unspecified: Secondary | ICD-10-CM

## 2016-06-05 MED ORDER — GI COCKTAIL ~~LOC~~
ORAL | Status: AC
  Filled 2016-06-05: qty 30

## 2016-06-05 MED ORDER — GI COCKTAIL ~~LOC~~
30.0000 mL | Freq: Once | ORAL | Status: AC
  Administered 2016-06-05: 30 mL via ORAL

## 2016-06-05 MED ORDER — ONDANSETRON HCL 4 MG PO TABS
4.0000 mg | ORAL_TABLET | Freq: Four times a day (QID) | ORAL | 0 refills | Status: DC
Start: 2016-06-05 — End: 2022-10-06

## 2016-06-05 MED ORDER — ONDANSETRON 4 MG PO TBDP
4.0000 mg | ORAL_TABLET | Freq: Once | ORAL | Status: AC
  Administered 2016-06-05: 4 mg via ORAL

## 2016-06-05 MED ORDER — ONDANSETRON 4 MG PO TBDP
ORAL_TABLET | ORAL | Status: AC
  Filled 2016-06-05: qty 1

## 2016-06-05 NOTE — ED Triage Notes (Signed)
Onset Saturday of chest pain.  Pain has been constant.  Mother reports when patient eats or drink has intermittent issues.  Some items stay down some not.  Mother thinks patient had a virus initially, some diarrhea on Friday.

## 2016-06-05 NOTE — ED Provider Notes (Signed)
[facility]    CSN: [id number] Arrival date & time: 06/05/16  [id number]  First Provider Contact:  None       History   Chief Complaint Chief Complaint  Patient presents with  . Chest Pain    HPI Margaret Cooper is a 30 y.o. female.    Chest Pain  Pain location:  L chest and epigastric Pain quality: burning   Pain severity:  Mild Onset quality:  Gradual Progression:  Unchanged Chronicity:  New Relieved by:  None tried Worsened by:  Nothing Ineffective treatments:  None tried Associated symptoms: heartburn and nausea   Associated symptoms: no abdominal pain and no vomiting     Past Medical History:  Diagnosis Date  . ADD (attention deficit disorder with hyperactivity)   . Down's syndrome   . Mild mental retardation   . OCD (obsessive compulsive disorder)   . Seizures [facility])     Patient Active Problem List   Diagnosis Date Noted  . Influenza A 10/20/2012  . ARDS (adult respiratory distress syndrome) (HCC) 10/16/2012  . Septic shock(785.52) 10/16/2012  . Acute respiratory failure with hypoxia (HCC) 10/16/2012  . 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 Down's syndrome 09/21/2011    Past Surgical History:  Procedure Laterality Date  . EXAMINATION UNDER ANESTHESIA  2008,2009  . EXAMINATION UNDER ANESTHESIA  11/14/2011   Procedure: EXAM UNDER ANESTHESIA;  Surgeon: Tereso Newcomer, MD;  Location: [facility];  Service: Gynecology;  Laterality: N/A;  Pap smear/down syndrome patient  . TOOTH EXTRACTION  01/25/2012   Procedure: DENTAL RESTORATION/EXTRACTIONS;  Surgeon: Esaw Dace., DDS;  Location: [facility];  Service: Oral Surgery;  Laterality: Bilateral;  recal exam, full mouth xrays, cleaning, T extraction, Two OL, Three DOL, Four O, Twelve O, Fourteen O, Fifteen O, Eighteen OF, Nineteen F, Twenty-eight F,     OB History    Gravida Para Term Preterm AB Living   0 0 0 0 0     SAB TAB Ectopic Multiple Live Births   0 0 0           Home Medications     Prior to Admission medications   Medication Sig Start Date End Date Taking? Authorizing Provider  albuterol (PROVENTIL HFA;VENTOLIN HFA) 108 (90 BASE) MCG/ACT inhaler Inhale 2 puffs into the lungs every 4 (four) hours as needed for wheezing or shortness of breath. 05/07/15   Hayden Rasmussen, NP  amphetamine-dextroamphetamine (ADDERALL XR) 15 MG 24 hr capsule Take 1 capsule (15 mg total) by mouth every morning. Patient not taking: Reported on 08/17/2015 10/25/12   Vassie Loll, MD  atomoxetine (STRATTERA) 40 MG capsule Take 1 capsule (40 mg total) by mouth 2 (two) times daily. Patient not taking: Reported on 08/17/2015 10/25/12   Vassie Loll, MD  LORazepam (ATIVAN) 1 MG tablet Take 1 tablet (1 mg total) by mouth 3 (three) times daily. Patient not taking: Reported on 08/17/2015 10/25/12   Vassie Loll, MD  pantoprazole (PROTONIX) 40 MG tablet Take 1 tablet (40 mg total) by mouth daily. Patient not taking: Reported on 08/17/2015 11/12/14   Toy Cookey, MD  QUEtiapine (SEROQUEL XR) 400 MG 24 hr tablet Take 1 tablet (400 mg total) by mouth at bedtime. Patient not taking: Reported on 08/17/2015 10/25/12   Vassie Loll, MD  Spacer/Aero-Holding Chambers (AEROCHAMBER PLUS) inhaler Use as instructed 05/07/15   Hayden Rasmussen, NP  Valproic Acid (DEPAKENE) 250 MG/5ML SYRP syrup Take 20-30 mLs (1,000-1,500 mg total) by mouth 2 (two) times  daily. Pt takes 1000 mg ( 20 ml)  in the morning and 1500 mg at night ( 30 ml ) Patient not taking: Reported on 08/17/2015 10/25/12   Vassie Loll, MD    Family History No family history on file.  Social History Social History  Substance Use Topics  . Smoking status: Never Smoker  . Smokeless tobacco: Never Used  . Alcohol use No     Allergies   Levaquin [levofloxacin in d5w]   Review of Systems Review of Systems  Constitutional: Negative.   HENT: Negative.   Eyes: Negative.   Respiratory: Negative.   Cardiovascular: Positive for chest pain.    Gastrointestinal: Positive for heartburn and nausea. Negative for abdominal pain and vomiting.  Genitourinary: Negative.   Neurological: Negative.   All other systems reviewed and are negative.    Physical Exam Triage Vital Signs ED Triage Vitals  Enc Vitals Group     BP 06/05/16 1006 123/76     Pulse Rate 06/05/16 1006 103     Resp 06/05/16 1006 16     Temp 06/05/16 1006 97.8 F (36.6 C)     Temp Source 06/05/16 1006 Oral     SpO2 06/05/16 1006 98 %     Weight --      Height --      Head Circumference --      Peak Flow --      Pain Score 06/05/16 1014 10     Pain Loc --      Pain Edu? --      Excl. in GC? --    No data found.   Updated Vital Signs BP 123/76 (BP Location: Right Arm)   Pulse 103   Temp 97.8 F (36.6 C) (Oral)   Resp 16   SpO2 98%   Visual Acuity Right Eye Distance:   Left Eye Distance:   Bilateral Distance:    Right Eye Near:   Left Eye Near:    Bilateral Near:     Physical Exam  Constitutional: She appears well-developed and well-nourished. No distress.  HENT:  Mouth/Throat: Oropharynx is clear and moist.  Cardiovascular: Normal rate, regular rhythm, normal heart sounds and intact distal pulses.   Pulmonary/Chest: Effort normal and breath sounds normal. She exhibits tenderness.  Nursing note and vitals reviewed.    UC Treatments / Results  Labs (all labs ordered are listed, but only abnormal results are displayed) Labs Reviewed - No data to display  EKG  EKG Interpretation None       Radiology No results found.  Procedures Procedures (including critical care time)  Medications Ordered in UC Medications - No data to display   Initial Impression / Assessment and Plan / UC Course  I have reviewed the triage vital signs and the nursing notes.  Pertinent labs & imaging results that were available during my care of the patient were reviewed by me and considered in my medical decision making (see chart for  details).  Clinical Course        Final Clinical Impressions(s) / UC Diagnoses   Final diagnoses:  None    New Prescriptions New Prescriptions   No medications on file     Linna Hoff, MD 06/05/16 1034

## 2016-06-18 ENCOUNTER — Encounter (HOSPITAL_COMMUNITY): Payer: Self-pay | Admitting: *Deleted

## 2016-06-19 ENCOUNTER — Encounter (HOSPITAL_COMMUNITY)
Admission: RE | Disposition: A | Discharge status: Home / Self Care | Payer: Self-pay | Source: Ambulatory Visit | Attending: Obstetrics & Gynecology

## 2016-06-19 ENCOUNTER — Ambulatory Visit (HOSPITAL_COMMUNITY): Payer: Medicaid Other | Admitting: Anesthesiology

## 2016-06-19 ENCOUNTER — Ambulatory Visit (HOSPITAL_COMMUNITY)
Admission: RE | Admit: 2016-06-19 | Discharge: 2016-06-19 | Disposition: A | Discharge status: Home / Self Care | Payer: Medicaid Other | Source: Ambulatory Visit | Attending: Obstetrics & Gynecology | Admitting: Obstetrics & Gynecology

## 2016-06-19 ENCOUNTER — Encounter (HOSPITAL_COMMUNITY): Payer: Self-pay | Admitting: Anesthesiology

## 2016-06-19 DIAGNOSIS — F70 Mild intellectual disabilities: Secondary | ICD-10-CM | POA: Diagnosis not present

## 2016-06-19 DIAGNOSIS — J45909 Unspecified asthma, uncomplicated: Secondary | ICD-10-CM | POA: Diagnosis not present

## 2016-06-19 DIAGNOSIS — K219 Gastro-esophageal reflux disease without esophagitis: Secondary | ICD-10-CM | POA: Diagnosis not present

## 2016-06-19 DIAGNOSIS — F79 Unspecified intellectual disabilities: Secondary | ICD-10-CM

## 2016-06-19 DIAGNOSIS — Q909 Down syndrome, unspecified: Secondary | ICD-10-CM | POA: Insufficient documentation

## 2016-06-19 DIAGNOSIS — Z124 Encounter for screening for malignant neoplasm of cervix: Secondary | ICD-10-CM | POA: Diagnosis not present

## 2016-06-19 DIAGNOSIS — Z01419 Encounter for gynecological examination (general) (routine) without abnormal findings: Secondary | ICD-10-CM | POA: Insufficient documentation

## 2016-06-19 DIAGNOSIS — Z1151 Encounter for screening for human papillomavirus (HPV): Secondary | ICD-10-CM | POA: Insufficient documentation

## 2016-06-19 HISTORY — DX: Bipolar disorder, unspecified: F31.9

## 2016-06-19 HISTORY — DX: Unspecified asthma, uncomplicated: J45.909

## 2016-06-19 HISTORY — DX: Gastro-esophageal reflux disease without esophagitis: K21.9

## 2016-06-19 HISTORY — DX: Major depressive disorder, single episode, unspecified: F32.9

## 2016-06-19 HISTORY — DX: Depression, unspecified: F32.A

## 2016-06-19 SURGERY — EXAM UNDER ANESTHESIA
Anesthesia: Monitor Anesthesia Care | Site: Vagina

## 2016-06-19 MED ORDER — FENTANYL CITRATE (PF) 100 MCG/2ML IJ SOLN
INTRAMUSCULAR | Status: AC
  Filled 2016-06-19: qty 2

## 2016-06-19 MED ORDER — MIDAZOLAM HCL 2 MG/2ML IJ SOLN
INTRAMUSCULAR | Status: AC
  Filled 2016-06-19: qty 2

## 2016-06-19 MED ORDER — DEXAMETHASONE SODIUM PHOSPHATE 4 MG/ML IJ SOLN
INTRAMUSCULAR | Status: AC
  Filled 2016-06-19: qty 1

## 2016-06-19 MED ORDER — PROPOFOL 10 MG/ML IV BOLUS
INTRAVENOUS | Status: AC
Start: 2016-06-19 — End: 2016-06-19
  Filled 2016-06-19: qty 20

## 2016-06-19 MED ORDER — PROPOFOL 10 MG/ML IV BOLUS
INTRAVENOUS | Status: AC
  Filled 2016-06-19: qty 20

## 2016-06-19 MED ORDER — LACTATED RINGERS IV SOLN
INTRAVENOUS | Status: DC
  Administered 2016-06-19: 15:00:00 via INTRAVENOUS

## 2016-06-19 MED ORDER — LIDOCAINE HCL (CARDIAC) 20 MG/ML IV SOLN
INTRAVENOUS | Status: AC
  Filled 2016-06-19: qty 5

## 2016-06-19 MED ORDER — LIDOCAINE HCL (CARDIAC) 20 MG/ML IV SOLN
INTRAVENOUS | Status: AC
Start: 2016-06-19 — End: 2016-06-19
  Filled 2016-06-19: qty 5

## 2016-06-19 MED ORDER — ROCURONIUM BROMIDE 100 MG/10ML IV SOLN
INTRAVENOUS | Status: AC
  Filled 2016-06-19: qty 1

## 2016-06-19 MED ORDER — MIDAZOLAM HCL 2 MG/2ML IJ SOLN
INTRAMUSCULAR | Status: DC | PRN
  Administered 2016-06-19: 2 mg via INTRAVENOUS

## 2016-06-19 MED ORDER — FENTANYL CITRATE (PF) 100 MCG/2ML IJ SOLN
INTRAMUSCULAR | Status: DC | PRN
  Administered 2016-06-19: 100 ug via INTRAVENOUS

## 2016-06-19 MED ORDER — ONDANSETRON HCL 4 MG/2ML IJ SOLN
INTRAMUSCULAR | Status: AC
  Filled 2016-06-19: qty 2

## 2016-06-19 MED ORDER — LACTATED RINGERS IV SOLN: INTRAVENOUS | Status: DC

## 2016-06-19 MED ORDER — KETOROLAC TROMETHAMINE 30 MG/ML IJ SOLN
INTRAMUSCULAR | Status: AC
  Filled 2016-06-19: qty 1

## 2016-06-19 SURGICAL SUPPLY — 6 items
CLOTH BEACON ORANGE TIMEOUT ST (SAFETY) ×2 IMPLANT
GLOVE BIO SURGEON STRL SZ 6.5 (GLOVE) ×2 IMPLANT
GLOVE BIOGEL PI IND STRL 7.0 (GLOVE) ×3 IMPLANT
GLOVE BIOGEL PI INDICATOR 7.0 (GLOVE) ×3
GLOVE ECLIPSE 7.0 STRL STRAW (GLOVE) ×2 IMPLANT
GOWN STRL REUS W/TWL LRG LVL3 (GOWN DISPOSABLE) ×4 IMPLANT

## 2016-06-19 NOTE — H&P (Signed)
Preoperative History and Physical  Margaret Cooper is a 30 y.o. G0P0000 here for exam under anesthesia and pap smear; she is unable to tolerate office exams due to her mental retardation secondary to Down's syndrome.  No significant preoperative concerns.  Past Medical History:  Diagnosis Date  . ADD (attention deficit disorder with hyperactivity)   . Asthma    seasonal - rarely uses inhaler  . Bipolar disorder (HCC)   . Depression   . Down's syndrome   . GERD (gastroesophageal reflux disease)   . Mild mental retardation   . OCD (obsessive compulsive disorder)   . Seizures [facility](HCC)    mother denies  "never has had a seizure"    Past Surgical History:  Procedure Laterality Date  . EXAMINATION UNDER ANESTHESIA  2008,2009  . EXAMINATION UNDER ANESTHESIA  11/14/2011   Procedure: EXAM UNDER ANESTHESIA;  Surgeon: Tereso NewcomerUgonna A Yarixa Lightcap, MD;  Location: [facility];  Service: Gynecology;  Laterality: N/A;  Pap smear/down syndrome patient  . TOOTH EXTRACTION  01/25/2012   Procedure: DENTAL RESTORATION/EXTRACTIONS;  Surgeon: Esaw DaceWilliam E Milner Jr., DDS;  [facility];  Service: Oral Surgery;  Laterality: Bilateral;  recal exam, full mouth xrays, cleaning, T extraction, Two OL, Three DOL, Four O, Twelve O, Fourteen O, Fifteen O, Eighteen OF, Nineteen F, Twenty-eight F,    OB History  Gravida Para Term Preterm AB Living  0 0 0 0 0    SAB TAB Ectopic Multiple Live Births  0 0 0          Patient denies any other pertinent gynecologic issues.   No current facility-administered medications on file prior to encounter.    Current Outpatient Prescriptions on File Prior to Encounter  Medication Sig Dispense Refill  . albuterol (PROVENTIL HFA;VENTOLIN HFA) 108 (90 BASE) MCG/ACT inhaler Inhale 2 puffs into the lungs every 4 (four) hours as needed for wheezing or shortness of breath. 1 Inhaler 0  . Spacer/Aero-Holding Chambers (AEROCHAMBER PLUS) inhaler Use as instructed 1 each 2   Allergies  Allergen  Reactions  . Levaquin [Levofloxacin In D5w] Rash    Social History:   reports that she has never smoked. She has never used smokeless tobacco. She reports that she does not drink alcohol or use drugs.  History reviewed. No pertinent family history.  Review of Systems: Noncontributory  PHYSICAL EXAM: Height 5\' 4"  (1.626 m), weight 174 lb (78.9 kg), last menstrual period 05/22/2016. CONSTITUTIONAL: Well-developed, well-nourished female in no acute distress.  HENT:  Normocephalic, atraumatic, External right and left ear normal. Oropharynx is clear and moist EYES: Conjunctivae and EOM are normal. Pupils are equal, round, and reactive to light. No scleral icterus.  NECK: Normal range of motion, supple, no masses SKIN: Skin is warm and dry. No rash noted. Not diaphoretic. No erythema. No pallor. NEUROLOGIC: Alert and oriented to person, place, and time. Normal reflexes, muscle tone coordination. No cranial nerve deficit noted. PSYCHIATRIC: Normal mood and affect. Normal behavior. Normal judgment and thought content. CARDIOVASCULAR: Normal heart rate noted, regular rhythm RESPIRATORY: Effort and breath sounds normal, no problems with respiration noted ABDOMEN: Soft, nontender, nondistended. PELVIC: Deferred MUSCULOSKELETAL: Normal range of motion. No edema and no tenderness. 2+ distal pulses.   Assessment: Patient Active Problem List   Diagnosis Date Noted  . Influenza A 10/20/2012  . ARDS (adult respiratory distress syndrome) (HCC) 10/16/2012  . Septic shock(785.52) 10/16/2012  . Acute respiratory failure with hypoxia (HCC) 10/16/2012  . 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 Down's  syndrome 09/21/2011    Plan: Patient will undergo exam under anesthesia and pap smear.  Consent obtained from mother.  To OR when ready.  Jaynie CollinsUGONNA  Bayli Quesinberry, M.D. 06/19/2016 1:42 PM

## 2016-06-19 NOTE — Op Note (Signed)
PREOPERATIVE DIAGNOSIS: 30 year old G0 female with Down's Syndrome in need of a Pap smear, breast and pelvic exam, unable to tolerate exam in the office. POSTOPERATIVE DIAGNOSIS: The same PROCEDURE: Pap smear with HPV typing SURGEON: Jaynie CollinsUgonna Anyanwu, MD ANESTHESIA: MAC FINDINGS: Normal breast exam bilaterally. Virginal introitus, Pederson speculum placement.  Small nulliparous cervix, also with small amount of bleeding after pap smear.  No vaginal lesions, small uterus, no masses.  SPECIMENS: Pap smear cytology plus HPV typing  ESTIMATED BLOOD LOSS: 5 ml.  COMPLICATIONS: None.  PROCEDURE: After written informed consent was obtained from her mother, the patient was taken to the operating room and MAC anesthesia was induced. The patient was placed in dorsal lithotomy position, breast examination was done and was normal.  A Pederson speculum was placed and the findings above were observed.  A Pap smear was obtained with the spatula and brush. A bimanual exam was negative for any abnormal masses.  The patient tolerated the procedure well, went to the recovery room in stable condition. All counts correct x2.   Jaynie CollinsUGONNA  ANYANWU, MD, FACOG Attending Obstetrician & Gynecologist, [facility], [facility]

## 2016-06-19 NOTE — Transfer of Care (Signed)
Immediate Anesthesia Transfer of Care Note  Patient: Dan EuropeCierra M Delauter  Procedure(s) Performed: Procedure(s): EXAM UNDER ANESTHESIA/PAP SMEAR (N/A)  Patient Location: PACU  Anesthesia Type:MAC  Level of Consciousness: awake, alert  and oriented  Airway & Oxygen Therapy: Patient Spontanous Breathing and Patient connected to nasal cannula oxygen  Post-op Assessment: Report given to RN, Post -op Vital signs reviewed and stable and Patient moving all extremities  Post vital signs: Reviewed and stable  Last Vitals:  Vitals:   06/19/16 1348  Pulse: 85  Resp: 18  Temp: 36.6 C    Last Pain:  Vitals:   06/19/16 1348  TempSrc: Oral      Patients Stated Pain Goal: 3 (06/19/16 1348)  Complications: No apparent anesthesia complications

## 2016-06-19 NOTE — Anesthesia Postprocedure Evaluation (Signed)
Anesthesia Post Note  Patient: Margaret Cooper  Procedure(s) Performed: Procedure(s) (LRB): EXAM UNDER ANESTHESIA/PAP SMEAR (N/A)  Patient location during evaluation: PACU Anesthesia Type: General Level of consciousness: sedated Vital Signs Assessment: post-procedure vital signs reviewed and stable Cardiovascular status: stable Postop Assessment: no signs of nausea or vomiting Anesthetic complications: no     Last Vitals:  Vitals:   06/19/16 1530 06/19/16 1545  BP: 116/66   Pulse: 95 90  Resp: 12 20  Temp:      Last Pain:  Vitals:   06/19/16 1348  TempSrc: Oral   Pain Goal: Patients Stated Pain Goal: 3 (06/19/16 1348)               Debraann Livingstone JR,JOHN Susann GivensFRANKLIN

## 2016-06-19 NOTE — Discharge Instructions (Addendum)
°  Post Anesthesia Home Care Instructions  Activity: Get plenty of rest for the remainder of the day. A responsible adult should stay with you for 24 hours following the procedure.  For the next 24 hours, DO NOT: -Drive a car -Advertising copywriterperate machinery -Drink alcoholic beverages -Take any medication unless instructed by your physician -Make any legal decisions or sign important papers.  Meals: Start with liquid foods such as gelatin or soup. Progress to regular foods as tolerated. Avoid greasy, spicy, heavy foods. If nausea and/or vomiting occur, drink only clear liquids until the nausea and/or vomiting subsides. Call your physician if vomiting continues.  Special Instructions/Symptoms: Your throat may feel dry or sore from the anesthesia or the breathing tube placed in your throat during surgery. If this causes discomfort, gargle with warm salt water. The discomfort should disappear within 24 hours.      Call for any concerns.

## 2016-06-19 NOTE — Progress Notes (Signed)
No PIV, attempt in right hand, No hematoma, bandaide applied

## 2016-06-19 NOTE — Anesthesia Preprocedure Evaluation (Addendum)
Anesthesia Evaluation  Patient identified by MRN, date of birth, ID band Patient awake    Reviewed: Allergy & Precautions, H&P , NPO status , Patient's Chart, lab work & pertinent test results  Airway Mallampati: I  TM Distance: >3 FB Neck ROM: full   Comment: Previous grade I view with [employer] [facility] Dental  (+) Poor Dentition   Pulmonary neg pulmonary ROS, asthma ,    Pulmonary exam normal        Cardiovascular negative cardio ROS Normal cardiovascular exam     Neuro/Psych PSYCHIATRIC DISORDERS (ADD, OCD) Depression Bipolar Disorder negative neurological ROS  negative psych ROS   GI/Hepatic negative GI ROS, Neg liver ROS, GERD  Medicated,  Endo/Other  negative endocrine ROS  Renal/GU negative Renal ROS     Musculoskeletal   Abdominal Normal abdominal exam  (+)   Peds  Hematology negative hematology ROS (+)   Anesthesia Other Findings Down's syndrome with mild mental retardation  Reproductive/Obstetrics negative OB ROS                           Anesthesia Physical Anesthesia Plan  ASA: II  Anesthesia Plan: MAC   Post-op Pain Management:    Induction:   Airway Management Planned:   Additional Equipment:   Intra-op Plan:   Post-operative Plan:   Informed Consent: I have reviewed the patients History and Physical, chart, labs and discussed the procedure including the risks, benefits and alternatives for the proposed anesthesia with the patient or authorized representative who has indicated his/her understanding and acceptance.     Plan Discussed with: CRNA and Surgeon  Anesthesia Plan Comments:         Anesthesia Quick Evaluation                                   Anesthesia Evaluation  Patient identified by MRN, date of birth, ID band Patient awake    Reviewed: Allergy & Precautions, H&P , NPO status , Patient's Chart, lab work & pertinent test results, reviewed  documented beta blocker date and time   History of Anesthesia Complications Negative for: history of anesthetic complications  Airway Mallampati: III TM Distance: >3 FB Neck ROM: full    Dental  (+) Teeth Intact   Pulmonary neg pulmonary ROS,  clear to auscultation  Pulmonary exam normal       Cardiovascular neg cardio ROS regular Normal    Neuro/Psych PSYCHIATRIC DISORDERS (ADD) Down Syndrome    GI/Hepatic negative GI ROS, Neg liver ROS,   Endo/Other  Negative Endocrine ROS  Renal/GU negative Renal ROS  Genitourinary negative   Musculoskeletal   Abdominal   Peds  Hematology negative hematology ROS (+)   Anesthesia Other Findings   Reproductive/Obstetrics negative OB ROS                           Anesthesia Physical Anesthesia Plan  ASA: II  Anesthesia Plan: MAC   Post-op Pain Management:    Induction:   Airway Management Planned:   Additional Equipment:   Intra-op Plan:   Post-operative Plan:   Informed Consent: I have reviewed the patients History and Physical, chart, labs and discussed the procedure including the risks, benefits and alternatives for the proposed anesthesia with the patient or authorized representative who has indicated his/her understanding and acceptance.   Dental Advisory Given  Plan Discussed with: CRNA and Surgeon  Anesthesia Plan Comments:         Anesthesia Quick Evaluation

## 2016-06-21 LAB — CYTOLOGY - PAP

## 2018-06-15 ENCOUNTER — Ambulatory Visit (HOSPITAL_COMMUNITY)
Admission: EM | Admit: 2018-06-15 | Discharge: 2018-06-15 | Disposition: A | Discharge status: Home / Self Care | Payer: Self-pay | Attending: Urgent Care | Admitting: Urgent Care

## 2018-06-15 ENCOUNTER — Other Ambulatory Visit: Payer: Self-pay

## 2018-06-15 ENCOUNTER — Encounter (HOSPITAL_COMMUNITY): Payer: Self-pay | Admitting: Emergency Medicine

## 2018-06-15 DIAGNOSIS — M79604 Pain in right leg: Secondary | ICD-10-CM

## 2018-06-15 DIAGNOSIS — S80861A Insect bite (nonvenomous), right lower leg, initial encounter: Secondary | ICD-10-CM

## 2018-06-15 DIAGNOSIS — W57XXXA Bitten or stung by nonvenomous insect and other nonvenomous arthropods, initial encounter: Secondary | ICD-10-CM

## 2018-06-15 MED ORDER — CEPHALEXIN 500 MG PO CAPS: 500.0000 mg | ORAL_CAPSULE | Freq: Two times a day (BID) | ORAL | 0 refills | Status: DC

## 2018-06-15 NOTE — ED Provider Notes (Signed)
  MRN: [id number] DOB: [birth year]  Subjective:   Margaret Cooper is a 32 y.o. female presenting for right posterior knee pain following with a suspect is an insect bite.  Patient reports that she is hurting over the same area.  Denies fever, nausea, vomiting, redness, drainage of pus or bleeding.  She has not kept the wound covered.  She has not tried medications for relief.  No current facility-administered medications for this encounter.   Current Outpatient Medications:  .  ARIPiprazole (ABILIFY) 10 MG tablet, Take 10 mg by mouth daily., Disp: , Rfl:  .  albuterol (PROVENTIL HFA;VENTOLIN HFA) 108 (90 BASE) MCG/ACT inhaler, Inhale 2 puffs into the lungs every 4 (four) hours as needed for wheezing or shortness of breath., Disp: 1 Inhaler, Rfl: 0 .  cephALEXin (KEFLEX) 500 MG capsule, Take 1 capsule (500 mg total) by mouth 2 (two) times daily., Disp: 10 capsule, Rfl: 0 .  ondansetron (ZOFRAN) 4 MG tablet, Take 1 tablet (4 mg total) by mouth every 6 (six) hours., Disp: 6 tablet, Rfl: 0 .  pantoprazole (PROTONIX) 40 MG tablet, Take 40 mg by mouth daily., Disp: , Rfl: 0 .  Spacer/Aero-Holding Chambers (AEROCHAMBER PLUS) inhaler, Use as instructed, Disp: 1 each, Rfl: 2 .  Valproate Sodium (VALPROIC ACID PO), Take 20 mg by mouth 2 (two) times daily. Takes 20mg  in the morning and 30 mg at qhs, Disp: , Rfl:    Allergies  Allergen Reactions  . Levaquin [Levofloxacin In D5w] Rash    Past Medical History:  Diagnosis Date  . ADD (attention deficit disorder with hyperactivity)   . Asthma    seasonal - rarely uses inhaler  . Bipolar disorder (HCC)   . Depression   . Down's syndrome   . GERD (gastroesophageal reflux disease)   . Mild mental retardation   . OCD (obsessive compulsive disorder)   . Seizures [facility](HCC)    mother denies  "never has had a seizure"      Past Surgical History:  Procedure Laterality Date  . EXAMINATION UNDER ANESTHESIA  2008,2009  . EXAMINATION UNDER ANESTHESIA  11/14/2011    Procedure: EXAM UNDER ANESTHESIA;  Surgeon: Tereso NewcomerUgonna A Anyanwu, MD;  Location: [facility];  Service: Gynecology;  Laterality: N/A;  Pap smear/down syndrome patient  . TOOTH EXTRACTION  01/25/2012   Procedure: DENTAL RESTORATION/EXTRACTIONS;  Surgeon: Esaw DaceWilliam E Milner Jr., DDS;  [facility];  Service: Oral Surgery;  Laterality: Bilateral;  recal exam, full mouth xrays, cleaning, T extraction, Two OL, Three DOL, Four O, Twelve O, Fourteen O, Fifteen O, Eighteen OF, Nineteen F, Twenty-eight F,     Objective:   Vitals: BP 98/61 (BP Location: Right Arm)   Pulse 76   Temp 98.3 F (36.8 C) (Oral)   Resp 16   SpO2 98%   Physical Exam  Constitutional: She is oriented to person, place, and time. She appears well-developed and well-nourished.  Cardiovascular: Normal rate.  Pulmonary/Chest: Effort normal.  Neurological: She is alert and oriented to person, place, and time.  Skin:       Assessment and Plan :   Insect bite of right lower leg, initial encounter  Pain of right lower extremity  Counseled on wound care, will start Keflex to address superficial infection secondary to an unknown insect bite.  Return to clinic precautions reviewed.     Wallis BambergMani, Gal Smolinski, [place]PA-C 06/15/18 [id number]

## 2018-06-15 NOTE — Discharge Instructions (Signed)
Keep the wound covered until you finish the antibiotics. You may take 500mg  Tylenol with ibuprofen 400-600mg  every 6 hours for pain and inflammation.

## 2018-06-15 NOTE — ED Triage Notes (Signed)
Pt has a bite behind her right knee that appeared last night.

## 2018-06-15 NOTE — ED Notes (Signed)
Nonadherent bandage and coband applied to right knee to cover insect bite.  Remaining coband given to pt's mother.

## 2021-05-11 ENCOUNTER — Other Ambulatory Visit: Payer: Self-pay

## 2021-05-11 ENCOUNTER — Emergency Department (HOSPITAL_COMMUNITY)
Admission: EM | Admit: 2021-05-11 | Discharge: 2021-05-12 | Disposition: A | Discharge status: Home / Self Care | Payer: Medicaid Other | Attending: Emergency Medicine | Admitting: Emergency Medicine

## 2021-05-11 ENCOUNTER — Encounter (HOSPITAL_COMMUNITY): Payer: Self-pay

## 2021-05-11 DIAGNOSIS — J45909 Unspecified asthma, uncomplicated: Secondary | ICD-10-CM | POA: Diagnosis not present

## 2021-05-11 DIAGNOSIS — R0989 Other specified symptoms and signs involving the circulatory and respiratory systems: Secondary | ICD-10-CM | POA: Insufficient documentation

## 2021-05-11 DIAGNOSIS — R198 Other specified symptoms and signs involving the digestive system and abdomen: Secondary | ICD-10-CM

## 2021-05-11 NOTE — ED Triage Notes (Signed)
Pt reports eating hamburgers and french fries tonight and has sensation that food remains in throat. Threw up after drinking afterwards.

## 2021-05-12 ENCOUNTER — Emergency Department (HOSPITAL_COMMUNITY): Payer: Medicaid Other

## 2021-05-12 MED ORDER — PANTOPRAZOLE SODIUM 40 MG PO TBEC: 40.0000 mg | DELAYED_RELEASE_TABLET | Freq: Every day | ORAL | 0 refills | Status: DC

## 2021-05-12 MED ORDER — GLUCAGON HCL RDNA (DIAGNOSTIC) 1 MG IJ SOLR
1.0000 mg | Freq: Once | INTRAMUSCULAR | Status: AC
  Administered 2021-05-12: 1 mg via INTRAMUSCULAR
  Filled 2021-05-12: qty 1

## 2021-05-12 MED ORDER — ALUM & MAG HYDROXIDE-SIMETH 200-200-20 MG/5ML PO SUSP
30.0000 mL | Freq: Once | ORAL | Status: AC
Start: 2021-05-12 — End: 2021-05-12
  Administered 2021-05-12: 30 mL via ORAL
  Filled 2021-05-12: qty 30

## 2021-05-12 MED ORDER — LIDOCAINE VISCOUS HCL 2 % MT SOLN
15.0000 mL | Freq: Once | OROMUCOSAL | Status: AC
  Administered 2021-05-12: 15 mL via ORAL
  Filled 2021-05-12: qty 15

## 2021-05-12 NOTE — ED Notes (Signed)
Patient returned from X-ray 

## 2021-05-12 NOTE — Discharge Instructions (Addendum)
Take the stomach medication as prescribed.  Avoid alcohol, caffeine, spicy foods, NSAID medications.  Follow-up with your primary doctor as well as gastroenterologist.  Return to the ED with new or worsening symptoms

## 2021-05-12 NOTE — ED Provider Notes (Signed)
[facility]EM[facility]Y DE[facility] Provider Note   CSN: [id number] Arrival date & time: 05/11/21  2211     History Chief Complaint  Patient presents with   Globus    Keliah TINSLEY EVERMAN is a 35 y.o. female.  Patient with history of Down syndrome, GERD, asthma here with possible food bolus.  Mother and patient reports she was eating hamburger and [place] fries from [employer] when she felt like something got stuck in her throat and she was not able to swallow.  She had several episodes of vomiting at home.  Feels like there is still something stuck in her throat.  She is able to swallow her spit.  Denies any chest pain, abdominal pain, difficulty breathing.  This has never happened before.  Denies eating anything with bones.  The history is provided by the patient.      Past Medical History:  Diagnosis Date   ADD (attention deficit disorder with hyperactivity)    Asthma    seasonal - rarely uses inhaler   Bipolar disorder (HCC)    Depression    Down's syndrome    GERD (gastroesophageal reflux disease)    Mild mental retardation    OCD (obsessive compulsive disorder)    Seizures (HCC)    mother denies  "never has had a seizure"     Patient Active Problem List   Diagnosis Date Noted   Mental retardation    Influenza A 10/20/2012   ARDS (adult respiratory distress syndrome) (HCC) 10/16/2012   Septic shock(785.52) 10/16/2012   Acute respiratory failure with hypoxia (HCC) 10/16/2012   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   Down's syndrome 09/21/2011    Past Surgical History:  Procedure Laterality Date   EXAMINATION UNDER ANESTHESIA  2008,2009   EXAMINATION UNDER ANESTHESIA  11/14/2011   Procedure: EXAM UNDER ANESTHESIA;  Surgeon: Tereso Newcomer, MD;  Location: [facility];  Service: Gynecology;  Laterality: N/A;  Pap smear/down syndrome patient   TOOTH EXTRACTION  01/25/2012   Procedure: DENTAL RESTORATION/EXTRACTIONS;  Surgeon: Esaw Dace., DDS;   Location: [facility] OR;  Service: Oral Surgery;  Laterality: Bilateral;  recal exam, full mouth xrays, cleaning, T extraction, Two OL, Three DOL, Four O, Twelve O, Fourteen O, Fifteen O, Eighteen OF, Nineteen F, Twenty-eight F,      OB History     Gravida  0   Para  0   Term  0   Preterm  0   AB  0   Living         SAB  0   IAB  0   Ectopic  0   Multiple      Live Births              No family history on file.  Social History   Tobacco Use   Smoking status: Never   Smokeless tobacco: Never  Substance Use Topics   Alcohol use: No   Drug use: No    Home Medications Prior to Admission medications   Medication Sig Start Date End Date Taking? Authorizing Provider  albuterol (PROVENTIL HFA;VENTOLIN HFA) 108 (90 BASE) MCG/ACT inhaler Inhale 2 puffs into the lungs every 4 (four) hours as needed for wheezing or shortness of breath. 05/07/15   Hayden Rasmussen, NP  ARIPiprazole (ABILIFY) 10 MG tablet Take 10 mg by mouth daily.    [provider]  cephALEXin (KEFLEX) 500 MG capsule Take 1 capsule (500 mg total) by mouth 2 (two) times daily. 06/15/18  Wallis Bamberg, PA-C  ondansetron (ZOFRAN) 4 MG tablet Take 1 tablet (4 mg total) by mouth every 6 (six) hours. 06/05/16   Linna Hoff, MD  pantoprazole (PROTONIX) 40 MG tablet Take 40 mg by mouth daily. 06/02/16   [provider]  Spacer/Aero-Holding Chambers (AEROCHAMBER PLUS) inhaler Use as instructed 05/07/15   Hayden Rasmussen, NP  Valproate Sodium (VALPROIC ACID PO) Take 20 mg by mouth 2 (two) times daily. Takes 20mg  in the morning and 30 mg at qhs    [provider]    Allergies    Levaquin [levofloxacin in d5w]  Review of Systems   Review of Systems  Constitutional:  Negative for activity change, appetite change, fatigue and fever.  HENT:  Positive for sore throat and trouble swallowing. Negative for congestion.   Respiratory:  Negative for cough, chest tightness and shortness of breath.   Cardiovascular:   Negative for chest pain.  Gastrointestinal:  Positive for nausea and vomiting. Negative for abdominal pain.  Genitourinary:  Positive for dysuria. Negative for hematuria.  Musculoskeletal:  Negative for arthralgias and myalgias.  Skin:  Negative for rash.  Neurological:  Negative for dizziness, weakness and headaches.   all other systems are negative except as noted in the HPI and PMH.   Physical Exam Updated Vital Signs BP 103/77 (BP Location: Right Arm)   Pulse 71   Temp 98 F (36.7 C) (Oral)   Resp 17   Ht 5\' 5"  (1.651 m)   Wt 72.6 kg   SpO2 100%   BMI 26.63 kg/m   Physical Exam Vitals and nursing note reviewed.  Constitutional:      General: She is not in acute distress.    Appearance: She is well-developed.  HENT:     Head: Normocephalic and atraumatic.     Mouth/Throat:     Pharynx: No oropharyngeal exudate.     Comments: Controlling secretions, no visible foreign body Eyes:     Conjunctiva/sclera: Conjunctivae normal.     Pupils: Pupils are equal, round, and reactive to light.  Neck:     Comments: No meningismus. Cardiovascular:     Rate and Rhythm: Normal rate and regular rhythm.     Heart sounds: Normal heart sounds. No murmur heard. Pulmonary:     Effort: Pulmonary effort is normal. No respiratory distress.     Breath sounds: Normal breath sounds.  Abdominal:     Palpations: Abdomen is soft.     Tenderness: There is no abdominal tenderness. There is no guarding or rebound.  Musculoskeletal:        General: No tenderness. Normal range of motion.     Cervical back: Normal range of motion and neck supple.  Skin:    General: Skin is warm.  Neurological:     Mental Status: She is alert and oriented to person, place, and time.     Cranial Nerves: No cranial nerve deficit.     Motor: No abnormal muscle tone.     Coordination: Coordination normal.     Comments:  5/5 strength throughout. CN 2-12 intact.Equal grip strength.   Psychiatric:        Behavior:  Behavior normal.    ED Results / Procedures / Treatments   Labs (all labs ordered are listed, but only abnormal results are displayed) Labs Reviewed - No data to display  EKG None  Radiology DG Neck Soft Tissue  Result Date: 05/12/2021 CLINICAL DATA:  Foreign body sensation in throat EXAM: NECK SOFT TISSUES -  1+ VIEW COMPARISON:  None. FINDINGS: There is no evidence of retropharyngeal soft tissue swelling or epiglottic enlargement. The cervical airway is unremarkable and no radio-opaque foreign body identified. IMPRESSION: Negative. Electronically Signed   By: Charline Bills M.D.   On: 05/12/2021 02:34   DG Chest 2 View  Result Date: 05/12/2021 CLINICAL DATA:  Foreign body sensation in throat EXAM: CHEST - 2 VIEW COMPARISON:  08/17/2015 FINDINGS: Lungs are clear.  No pleural effusion or pneumothorax. The heart is normal in size. Visualized osseous structures are within normal limits. IMPRESSION: Normal chest radiographs. Electronically Signed   By: Charline Bills M.D.   On: 05/12/2021 02:34    Procedures Procedures   Medications Ordered in ED Medications  alum & mag hydroxide-simeth (MAALOX/MYLANTA) 200-200-20 MG/5ML suspension 30 mL (has no administration in time range)    And  lidocaine (XYLOCAINE) 2 % viscous mouth solution 15 mL (has no administration in time range)  glucagon (human recombinant) (GLUCAGEN) injection 1 mg (has no administration in time range)    ED Course  I have reviewed the triage vital signs and the nursing notes.  Pertinent labs & imaging results that were available during my care of the patient were reviewed by me and considered in my medical decision making (see chart for details).    MDM Rules/Calculators/A&P                         Globus sensation, able to swallow secretions.  No distress.  Will give GI cocktail and IM glucagon  X-rays negative for foreign body.  Patient feels improved after IM glucagon and GI cocktail.  She is able to  tolerate p.o. without difficulty.  Will start PPI for suspected component of acid reflux disease.  Follow-up with PCP as well as gastroenterology.  Return precautions discussed Avoid alcohol, caffeine, NSAIDs, spicy foods. Final Clinical Impression(s) / ED Diagnoses Final diagnoses:  Globus sensation    Rx / DC Orders ED Discharge Orders     None        Destani Wamser, Jeannett Senior, MD 05/12/21 [phone]

## 2021-05-12 NOTE — ED Notes (Signed)
Pt given juice for PO fluid challenge.

## 2021-11-11 ENCOUNTER — Other Ambulatory Visit: Payer: Self-pay

## 2021-11-11 ENCOUNTER — Ambulatory Visit (HOSPITAL_COMMUNITY)
Admission: EM | Admit: 2021-11-11 | Discharge: 2021-11-11 | Disposition: A | Discharge status: Home / Self Care | Payer: Medicaid Other | Attending: Physician Assistant | Admitting: Physician Assistant

## 2021-11-11 ENCOUNTER — Encounter (HOSPITAL_COMMUNITY): Payer: Self-pay | Admitting: *Deleted

## 2021-11-11 DIAGNOSIS — M79601 Pain in right arm: Secondary | ICD-10-CM

## 2021-11-11 MED ORDER — PREDNISONE 20 MG PO TABS: 40.0000 mg | ORAL_TABLET | Freq: Every day | ORAL | 0 refills | Status: AC
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
## 2021-11-17 ENCOUNTER — Ambulatory Visit (INDEPENDENT_AMBULATORY_CARE_PROVIDER_SITE_OTHER): Payer: Medicaid Other

## 2021-11-17 ENCOUNTER — Other Ambulatory Visit: Payer: Self-pay

## 2021-11-17 ENCOUNTER — Encounter (HOSPITAL_COMMUNITY): Payer: Self-pay

## 2021-11-17 ENCOUNTER — Ambulatory Visit (HOSPITAL_COMMUNITY)
Admission: EM | Admit: 2021-11-17 | Discharge: 2021-11-17 | Disposition: A | Discharge status: Home / Self Care | Payer: Medicaid Other | Attending: Physician Assistant | Admitting: Physician Assistant

## 2021-11-17 DIAGNOSIS — M79601 Pain in right arm: Secondary | ICD-10-CM | POA: Diagnosis not present

## 2021-11-17 DIAGNOSIS — M25531 Pain in right wrist: Secondary | ICD-10-CM

## 2021-11-17 MED ORDER — NAPROXEN 375 MG PO TABS: 375.0000 mg | ORAL_TABLET | Freq: Two times a day (BID) | ORAL | 0 refills | Status: DC
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
## 2021-11-17 NOTE — Discharge Instructions (Signed)
Her x-ray was normal.  Use wrist brace to provide support and pain relief.  Take Naprosyn twice daily.  She should not take additional NSAIDs including aspirin, ibuprofen/Advil, naproxen/Aleve with this medication as it can cause stomach bleeding.  She can use Tylenol for breakthrough pain.  Use elevation and ice for additional symptom relief.  If symptoms do not improve quickly with conservative treatment please follow-up with sports medicine as we discussed.  If she has any sudden worsening of symptoms please return for reevaluation.

## 2021-11-29 ENCOUNTER — Ambulatory Visit (HOSPITAL_COMMUNITY)
Admission: EM | Admit: 2021-11-29 | Discharge: 2021-11-29 | Disposition: A | Discharge status: Home / Self Care | Payer: Medicaid Other

## 2021-11-29 ENCOUNTER — Encounter (HOSPITAL_COMMUNITY): Payer: Self-pay | Admitting: Emergency Medicine

## 2021-11-29 ENCOUNTER — Other Ambulatory Visit: Payer: Self-pay

## 2021-11-29 DIAGNOSIS — M25531 Pain in right wrist: Secondary | ICD-10-CM
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
## 2021-11-29 NOTE — ED Triage Notes (Signed)
Mother reports that patient was seen here recently and had xray on right arm and wanting the results of it.  Pt has wrist brace on right arm that received here and mother reports patient still complaining about her arm. When asking patient about if painful, pt responds "feels strange". Mother questioning size of brace.

## 2021-11-29 NOTE — Discharge Instructions (Signed)
As we discussed, her x-rays were normal.  Try to stop using the brace over the next week so that her normal movements can help rehabilitate her wrist.  If she is not improving over the next month, please give the sports medicine office a call to schedule an appointment.

## 2021-12-29 ENCOUNTER — Ambulatory Visit (INDEPENDENT_AMBULATORY_CARE_PROVIDER_SITE_OTHER): Payer: Medicaid Other | Admitting: Family Medicine

## 2021-12-29 ENCOUNTER — Other Ambulatory Visit: Payer: Self-pay

## 2021-12-29 VITALS — BP 94/58 | Ht 64.0 in

## 2021-12-29 DIAGNOSIS — M25531 Pain in right wrist: Secondary | ICD-10-CM | POA: Diagnosis not present

## 2021-12-29 DIAGNOSIS — M79631 Pain in right forearm: Secondary | ICD-10-CM
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
## 2021-12-29 NOTE — Patient Instructions (Signed)
We saw you for right wrist and arm pain. You likely have a muscle strain. Do home exercises for 2 weeks.

## 2022-01-24 ENCOUNTER — Encounter: Payer: Medicaid Other | Admitting: Obstetrics and Gynecology

## 2022-10-06 ENCOUNTER — Encounter (HOSPITAL_COMMUNITY): Payer: Self-pay

## 2022-10-06 ENCOUNTER — Ambulatory Visit (HOSPITAL_COMMUNITY)
Admission: EM | Admit: 2022-10-06 | Discharge: 2022-10-06 | Disposition: A | Discharge status: Home / Self Care | Payer: Medicaid Other | Attending: Emergency Medicine | Admitting: Emergency Medicine

## 2022-10-06 DIAGNOSIS — J020 Streptococcal pharyngitis: Secondary | ICD-10-CM | POA: Diagnosis not present

## 2022-10-06 DIAGNOSIS — Z8709 Personal history of other diseases of the respiratory system: Secondary | ICD-10-CM | POA: Diagnosis not present

## 2022-10-06 LAB — POCT RAPID STREP A, ED / UC: Streptococcus, Group A Screen (Direct): POSITIVE — AB

## 2022-10-06 MED ORDER — ONDANSETRON 4 MG PO TBDP
4.0000 mg | ORAL_TABLET | Freq: Once | ORAL | Status: AC
  Administered 2022-10-06: 4 mg via ORAL

## 2022-10-06 MED ORDER — ALBUTEROL SULFATE HFA 108 (90 BASE) MCG/ACT IN AERS
INHALATION_SPRAY | RESPIRATORY_TRACT | Status: AC
  Filled 2022-10-06: qty 6.7

## 2022-10-06 MED ORDER — ALBUTEROL SULFATE HFA 108 (90 BASE) MCG/ACT IN AERS
2.0000 | INHALATION_SPRAY | Freq: Once | RESPIRATORY_TRACT | Status: AC
  Administered 2022-10-06: 2 via RESPIRATORY_TRACT

## 2022-10-06 MED ORDER — ONDANSETRON 4 MG PO TBDP
ORAL_TABLET | ORAL | Status: AC
  Filled 2022-10-06: qty 1

## 2022-10-06 MED ORDER — ALBUTEROL SULFATE (2.5 MG/3ML) 0.083% IN NEBU
INHALATION_SOLUTION | RESPIRATORY_TRACT | Status: AC
  Filled 2022-10-06: qty 3

## 2022-10-06 MED ORDER — ONDANSETRON HCL 4 MG PO TABS: 4.0000 mg | ORAL_TABLET | Freq: Four times a day (QID) | ORAL | 0 refills | Status: DC

## 2022-10-06 MED ORDER — CEPHALEXIN 500 MG PO CAPS: 500.0000 mg | ORAL_CAPSULE | Freq: Two times a day (BID) | ORAL | 0 refills | Status: DC

## 2022-10-06 NOTE — ED Provider Notes (Signed)
[facility]    CSN: [id number] Arrival date & time: 10/06/22  1250      History   Chief Complaint Chief Complaint  Patient presents with   Cough    HPI Margaret Cooper is a 36 y.o. female.  Patient presents complaining of productive cough and emesis that started today.  Patient reports having 3 episodes of emesis today.  Patient reports sore throat with painful swallowing. Patient reports shortness of breath at times.  Patient's caregiver denies any wheezing.  Patient's caregiver denies any fever or chills.  Patient's caregiver denies any recent exposure to a viral illness.  Patient has a history of asthma.  Patient's caregiver reports that she does not currently use an inhaler.  Patient has not taken any medications for symptoms.   Cough Associated symptoms: rhinorrhea, shortness of breath and sore throat   Associated symptoms: no chest pain, no chills, no ear pain, no fever and no wheezing     Past Medical History:  Diagnosis Date   ADD (attention deficit disorder with hyperactivity)    Asthma    seasonal - rarely uses inhaler   Bipolar disorder (HCC)    Depression    Down's syndrome    GERD (gastroesophageal reflux disease)    Mild mental retardation    OCD (obsessive compulsive disorder)    Seizures (HCC)    mother denies  "never has had a seizure"     Patient Active Problem List   Diagnosis Date Noted   Mental retardation    Influenza A 10/20/2012   ARDS (adult respiratory distress syndrome) (HCC) 10/16/2012   Septic shock(785.52) 10/16/2012   Acute respiratory failure with hypoxia (HCC) 10/16/2012   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   Down's syndrome 09/21/2011    Past Surgical History:  Procedure Laterality Date   EXAMINATION UNDER ANESTHESIA  2008,2009   EXAMINATION UNDER ANESTHESIA  11/14/2011   Procedure: EXAM UNDER ANESTHESIA;  Surgeon: Tereso Newcomer, MD;  Location: [facility];  Service: Gynecology;  Laterality: N/A;  Pap smear/down  syndrome patient   TOOTH EXTRACTION  01/25/2012   Procedure: DENTAL RESTORATION/EXTRACTIONS;  Surgeon: Esaw Dace., DDS;  Location: [facility];  Service: Oral Surgery;  Laterality: Bilateral;  recal exam, full mouth xrays, cleaning, T extraction, Two OL, Three DOL, Four O, Twelve O, Fourteen O, Fifteen O, Eighteen OF, Nineteen F, Twenty-eight F,     OB History     Gravida  0   Para  0   Term  0   Preterm  0   AB  0   Living         SAB  0   IAB  0   Ectopic  0   Multiple      Live Births               Home Medications    Prior to Admission medications   Medication Sig Start Date End Date Taking? Authorizing Provider  cephALEXin (KEFLEX) 500 MG capsule Take 1 capsule (500 mg total) by mouth 2 (two) times daily. 10/06/22  Yes Debby Freiberg, NP  ondansetron (ZOFRAN) 4 MG tablet Take 1 tablet (4 mg total) by mouth every 6 (six) hours. 10/06/22  Yes Debby Freiberg, NP  albuterol (PROVENTIL HFA;VENTOLIN HFA) 108 (90 BASE) MCG/ACT inhaler Inhale 2 puffs into the lungs every 4 (four) hours as needed for wheezing or shortness of breath. 05/07/15   Hayden Rasmussen, NP  ARIPiprazole (ABILIFY) 10 MG tablet  Take 10 mg by mouth daily.    [provider]  naproxen (NAPROSYN) 375 MG tablet Take 1 tablet (375 mg total) by mouth 2 (two) times daily. 11/17/21   Raspet, Derry Skill, PA-C  pantoprazole (PROTONIX) 40 MG tablet Take 1 tablet (40 mg total) by mouth daily. 05/12/21   Ezequiel Essex, MD  Spacer/Aero-Holding Chambers (AEROCHAMBER PLUS) inhaler Use as instructed 05/07/15   Janne Napoleon, NP  Valproate Sodium (VALPROIC ACID PO) Take 20 mg by mouth 2 (two) times daily. Takes 20mg  in the morning and 30 mg at qhs    [provider]    Family History History reviewed. No pertinent family history.  Social History Social History   Tobacco Use   Smoking status: Never   Smokeless tobacco: Never  Substance Use Topics   Alcohol use: No   Drug use: No      Allergies   Levaquin [levofloxacin in d5w]   Review of Systems Review of Systems  Constitutional:  Positive for appetite change and fatigue. Negative for activity change, chills and fever.  HENT:  Positive for rhinorrhea and sore throat. Negative for congestion, ear discharge, ear pain, postnasal drip, sinus pressure, sinus pain, sneezing, tinnitus, trouble swallowing and voice change.   Eyes: Negative.   Respiratory:  Positive for cough and shortness of breath. Negative for apnea, choking, chest tightness, wheezing and stridor.   Cardiovascular:  Negative for chest pain and palpitations.  Gastrointestinal:  Positive for abdominal pain, nausea and vomiting. Negative for diarrhea.     Physical Exam Triage Vital Signs ED Triage Vitals [10/06/22 1527]  Enc Vitals Group     BP 105/72     Pulse Rate (!) 113     Resp 18     Temp 98.4 F (36.9 C)     Temp Source Oral     SpO2 90 %     Weight      Height      Head Circumference      Peak Flow      Pain Score      Pain Loc      Pain Edu?      Excl. in [place]?    No data found.  Updated Vital Signs BP 105/72 (BP Location: Left Wrist)   Pulse (!) 113   Temp 98.4 F (36.9 C) (Oral)   Resp 18   LMP 09/24/2022   SpO2 94%      Physical Exam Vitals and nursing note reviewed.  Constitutional:      Appearance: Normal appearance.  HENT:     Right Ear: Hearing, tympanic membrane, ear canal and external ear normal.     Left Ear: Hearing, tympanic membrane, ear canal and external ear normal.     Nose: Rhinorrhea present. No congestion. Rhinorrhea is clear.     Right Turbinates: Not enlarged, swollen or pale.     Left Turbinates: Not enlarged, swollen or pale.     Right Sinus: No maxillary sinus tenderness or frontal sinus tenderness.     Left Sinus: No maxillary sinus tenderness or frontal sinus tenderness.     Mouth/Throat:     Mouth: Mucous membranes are moist.     Dentition: Normal dentition.     Pharynx: Posterior  oropharyngeal erythema present. No pharyngeal swelling, oropharyngeal exudate or uvula swelling.     Tonsils: No tonsillar exudate or tonsillar abscesses. 1+ on the right. 1+ on the left.  Cardiovascular:     Rate and Rhythm: Normal  rate and regular rhythm.     Heart sounds: Normal heart sounds, S1 normal and S2 normal.  Pulmonary:     Effort: Pulmonary effort is normal.     Breath sounds: Normal breath sounds. No decreased breath sounds, wheezing, rhonchi or rales.  Abdominal:     General: Abdomen is flat. Bowel sounds are normal. There is no distension or abdominal bruit. There are no signs of injury.     Palpations: There is no shifting dullness, fluid wave, hepatomegaly, splenomegaly, mass or pulsatile mass.     Tenderness: There is generalized abdominal tenderness. There is no guarding or rebound. Negative signs include Murphy's sign and McBurney's sign.  Lymphadenopathy:     Cervical: No cervical adenopathy.  Neurological:     Mental Status: She is alert.  Psychiatric:        Behavior: Behavior is cooperative.      UC Treatments / Results  Labs (all labs ordered are listed, but only abnormal results are displayed) Labs Reviewed  POCT RAPID STREP A, ED / UC - Abnormal; Notable for the following components:      Result Value   Streptococcus, Group A Screen (Direct) POSITIVE (*)    All other components within normal limits    EKG   Radiology No results found.  Procedures Procedures (including critical care time)  Medications Ordered in UC Medications  albuterol (VENTOLIN HFA) 108 (90 Base) MCG/ACT inhaler 2 puff (2 puffs Inhalation Given 10/06/22 1619)  ondansetron (ZOFRAN-ODT) disintegrating tablet 4 mg (4 mg Oral Given 10/06/22 1617)    Initial Impression / Assessment and Plan / UC Course  I have reviewed the triage vital signs and the nursing notes.  Pertinent labs & imaging results that were available during my care of the patient were reviewed by me and  considered in my medical decision making (see chart for details).     Patient was evaluated for streptococcal sore throat and history of asthma.  Positive POC strep test.  Patient was given Zofran and albuterol in office, patient reported relief of SOB, ABD discomfort and nausea symptoms after medication administration.  Cephalexin and Zofran prescription was sent to the pharmacy.  Patient's caregiver was made aware of treatment regiment and possible side effects.  Patient's caregiver was made aware that patient should use albuterol inhaler at home for shortness of breath as needed.Patient made aware of timeline for symptom resolution and when follow-up would be necessary.  Patient made aware of results reporting protocol and MyChart.  Patient verbalized understanding of instructions.   Charting was provided using a a verbal dictation system, charting was proofread for errors, errors may occur which could change the meaning of the information charted.   Final Clinical Impressions(s) / UC Diagnoses   Final diagnoses:  Streptococcal sore throat  History of asthma     Discharge Instructions      Cephalexin is the antibiotic that been sent to the pharmacy, you can take this 2 times daily for the next 7 days.  Zofran has been sent to the pharmacy to help with nausea, you can take this every 6 hours as needed.   The Albuterol inhaler given in office can be used every 4-6 hours as needed for SOB.   Please follow up if symptoms are not improving.      ED Prescriptions     Medication Sig Dispense Auth. Provider   cephALEXin (KEFLEX) 500 MG capsule Take 1 capsule (500 mg total) by mouth 2 (two) times daily.  14 capsule Theda Payer N, NP   ondansetron (ZOFRAN) 4 MG tablet Take 1 tablet (4 mg total) by mouth every 6 (six) hours. 16 tablet Flossie Dibble, NP      PDMP not reviewed this encounter.   Flossie Dibble, NP 10/06/22 1755

## 2022-10-06 NOTE — ED Triage Notes (Signed)
Pt is here for cough ,abdominal pain and tightness in her chest x 1dy

## 2022-10-06 NOTE — Discharge Instructions (Signed)
Cephalexin is the antibiotic that been sent to the pharmacy, you can take this 2 times daily for the next 7 days.  Zofran has been sent to the pharmacy to help with nausea, you can take this every 6 hours as needed.   The Albuterol inhaler given in office can be used every 4-6 hours as needed for SOB.   Please follow up if symptoms are not improving.

## 2022-10-07 ENCOUNTER — Telehealth (HOSPITAL_COMMUNITY): Payer: Self-pay | Admitting: *Deleted

## 2022-10-07 MED ORDER — CEPHALEXIN 250 MG/5ML PO SUSR: 500.0000 mg | Freq: Two times a day (BID) | ORAL | 0 refills | Status: AC

## 2022-10-07 NOTE — Telephone Encounter (Signed)
T/C from mother stating that pt has been unable to swallow the cephalexin pills; requesting liquid alternative. States the tiny ondansetron pills are not a problem for the pt. Informed mother to no longer attempt giving pt the abx pills, and that we will send over Rx for cephalexin liquid. Mother verbalized understanding.

## 2023-01-07 IMAGING — CR DG NECK SOFT TISSUE
2 series · 2 of 2 positions shown · non-contrast
Comparison: None.

CLINICAL DATA: Foreign body sensation in throat

EXAM:
NECK SOFT TISSUES - 1+ VIEW

[w soft tissue neck ap]
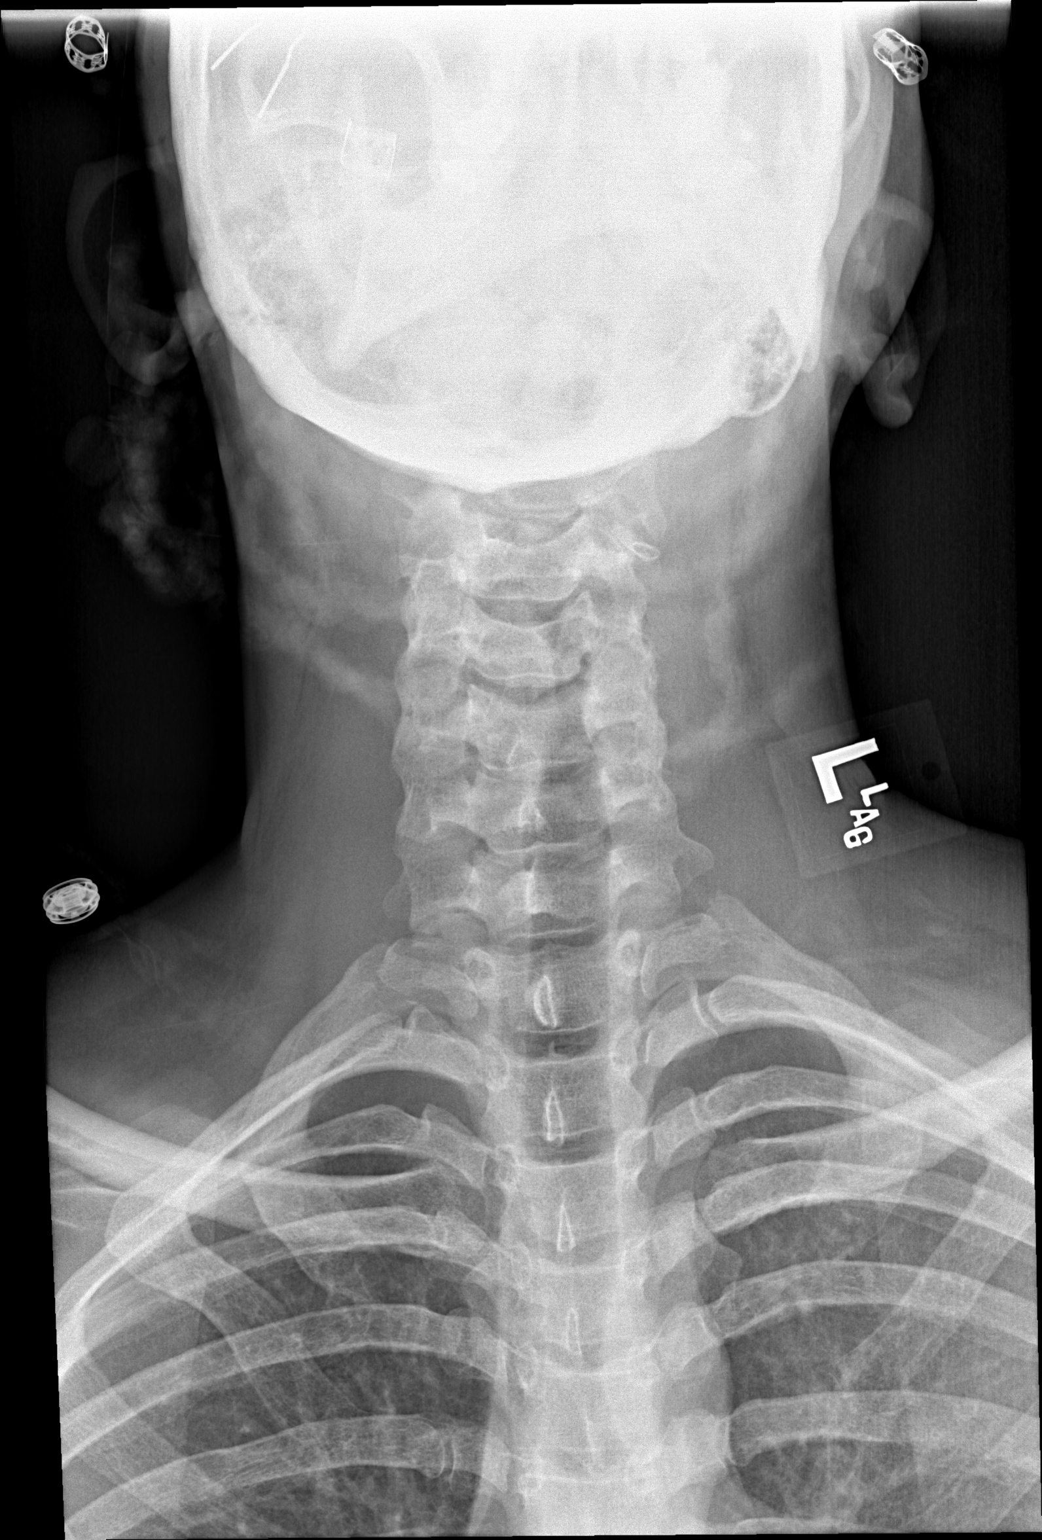

[w soft tissue neck lat]
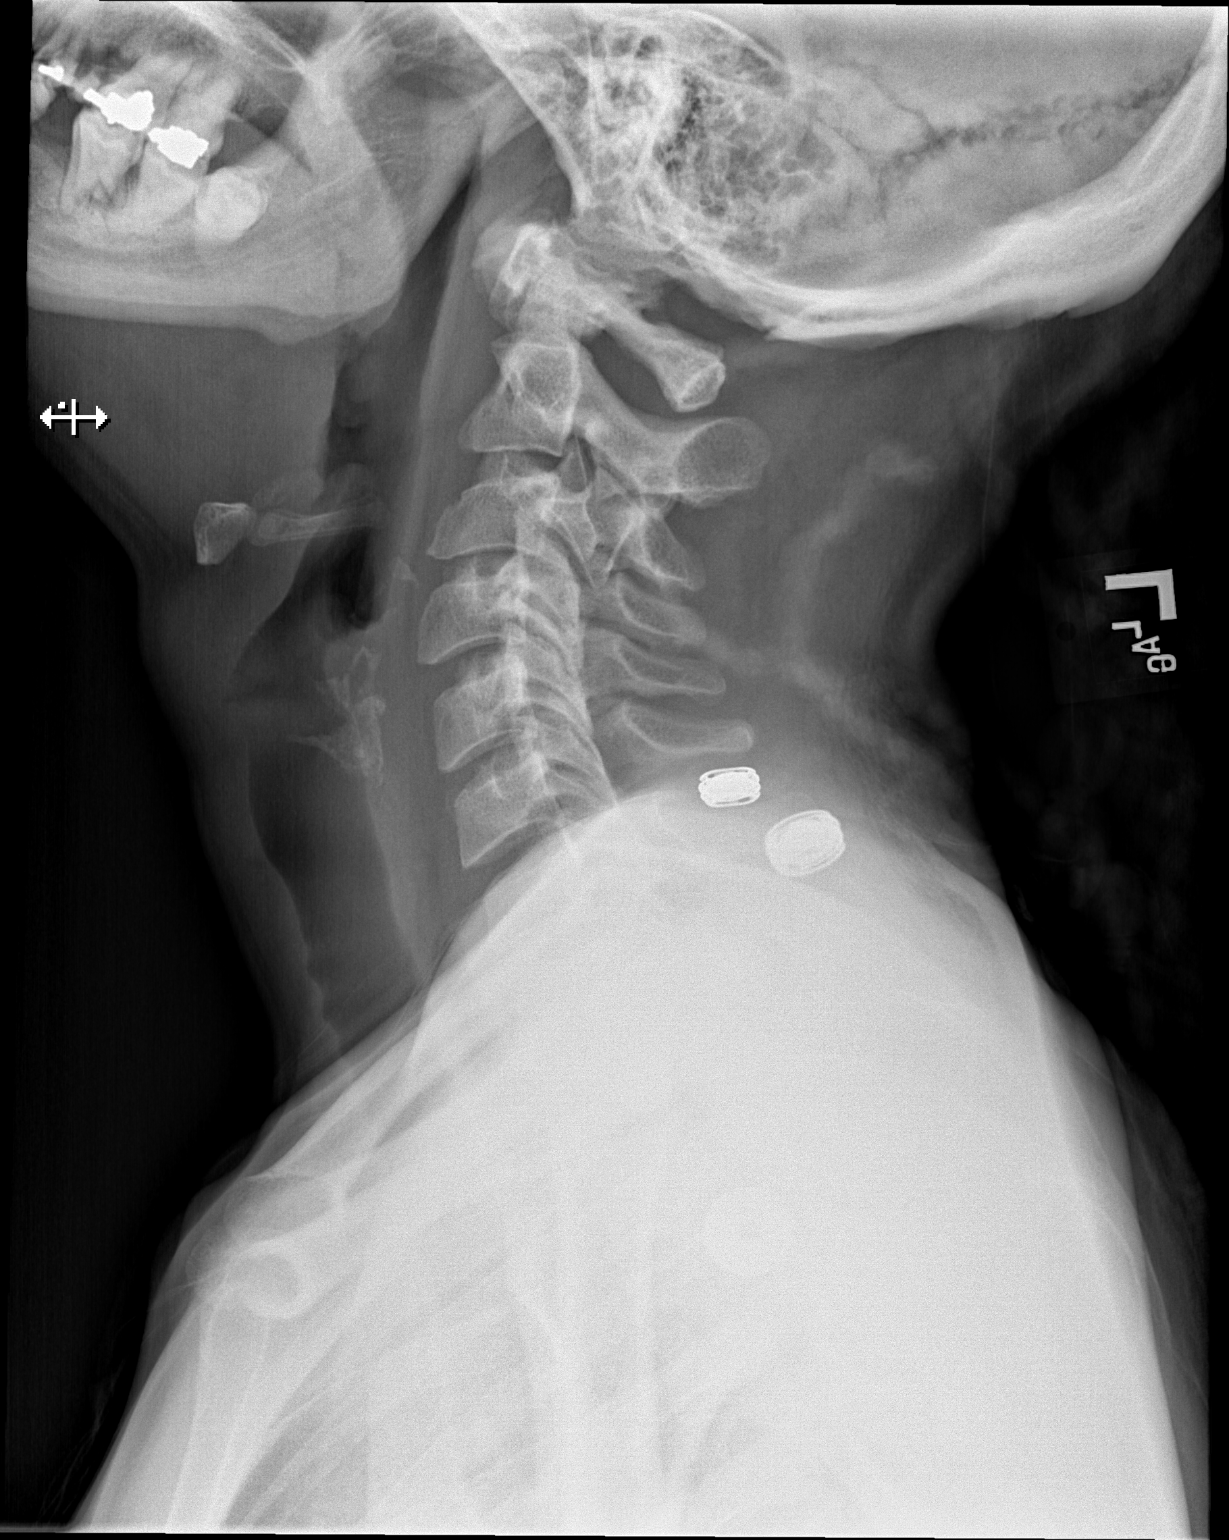

[2 of 2 positions shown; findings below may reference images not displayed]

FINDINGS: There is no evidence of retropharyngeal soft tissue swelling or
epiglottic enlargement. The cervical airway is unremarkable and no
radio-opaque foreign body identified.
IMPRESSION: Negative.

## 2023-01-08 ENCOUNTER — Ambulatory Visit: Payer: Medicaid Other | Attending: Physician Assistant | Admitting: Occupational Therapy

## 2023-01-08 DIAGNOSIS — M79601 Pain in right arm: Secondary | ICD-10-CM | POA: Insufficient documentation

## 2023-01-08 DIAGNOSIS — Q909 Down syndrome, unspecified: Secondary | ICD-10-CM | POA: Diagnosis present
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
## 2023-01-16 ENCOUNTER — Ambulatory Visit: Payer: Medicaid Other | Admitting: Occupational Therapy

## 2023-01-23 ENCOUNTER — Ambulatory Visit: Payer: Medicaid Other | Admitting: Occupational Therapy

## 2023-01-23 ENCOUNTER — Encounter: Payer: Self-pay | Admitting: Occupational Therapy

## 2023-01-23 DIAGNOSIS — M79601 Pain in right arm: Secondary | ICD-10-CM

## 2023-01-23 DIAGNOSIS — Q909 Down syndrome, unspecified: Secondary | ICD-10-CM
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
## 2023-01-30 ENCOUNTER — Ambulatory Visit: Payer: Medicaid Other | Admitting: Rehabilitative and Restorative Service Providers"

## 2023-02-06 ENCOUNTER — Ambulatory Visit: Payer: Medicaid Other | Admitting: Occupational Therapy

## 2023-02-13 ENCOUNTER — Ambulatory Visit: Payer: Medicaid Other | Attending: Physician Assistant | Admitting: Occupational Therapy

## 2023-03-20 ENCOUNTER — Other Ambulatory Visit: Payer: Self-pay

## 2023-03-20 ENCOUNTER — Ambulatory Visit: Payer: Medicaid Other | Attending: Physician Assistant | Admitting: Occupational Therapy

## 2023-03-20 ENCOUNTER — Encounter: Payer: Self-pay | Admitting: Occupational Therapy

## 2023-03-20 DIAGNOSIS — R278 Other lack of coordination: Secondary | ICD-10-CM | POA: Insufficient documentation

## 2023-03-20 DIAGNOSIS — Q909 Down syndrome, unspecified: Secondary | ICD-10-CM | POA: Insufficient documentation

## 2023-03-20 DIAGNOSIS — M6281 Muscle weakness (generalized): Secondary | ICD-10-CM | POA: Insufficient documentation

## 2023-03-20 DIAGNOSIS — M79601 Pain in right arm: Secondary | ICD-10-CM | POA: Diagnosis present
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
## 2023-03-25 ENCOUNTER — Ambulatory Visit: Payer: Medicaid Other | Admitting: Occupational Therapy

## 2023-03-25 DIAGNOSIS — M79601 Pain in right arm: Secondary | ICD-10-CM | POA: Diagnosis not present

## 2023-03-25 DIAGNOSIS — R278 Other lack of coordination: Secondary | ICD-10-CM

## 2023-03-25 DIAGNOSIS — M6281 Muscle weakness (generalized): Secondary | ICD-10-CM

## 2023-03-25 NOTE — Patient Instructions (Signed)
  Coordination Activities  Perform the following activities for a 5-10 minutes 2 times per day with right hand.   Rotate ball in fingertips (clockwise and counter-clockwise). Flip cards 1 at a time as fast as you can. Deal cards with your thumb (Hold deck in hand and push card off top with thumb). Shuffle cards. Pick up coins, buttons, marbles, dried beans/pasta of different sizes and place in container. Pick up coins and place in container or coin bank. Pick up coins one at a time until you get 5-10 in your hand, then move coins from palm to fingertips to stack one at a time.
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
## 2023-04-03 ENCOUNTER — Ambulatory Visit: Payer: Medicaid Other | Admitting: Occupational Therapy

## 2023-04-03 DIAGNOSIS — R278 Other lack of coordination: Secondary | ICD-10-CM

## 2023-04-03 DIAGNOSIS — M6281 Muscle weakness (generalized): Secondary | ICD-10-CM

## 2023-04-03 DIAGNOSIS — M79601 Pain in right arm: Secondary | ICD-10-CM
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
## 2023-04-08 ENCOUNTER — Ambulatory Visit: Payer: Medicaid Other | Attending: Physician Assistant | Admitting: Occupational Therapy

## 2023-04-15 ENCOUNTER — Ambulatory Visit: Payer: Medicaid Other | Admitting: Occupational Therapy

## 2023-04-22 ENCOUNTER — Ambulatory Visit: Payer: Medicaid Other | Admitting: Occupational Therapy

## 2023-04-25 ENCOUNTER — Encounter: Payer: Self-pay | Admitting: Occupational Therapy

## 2023-04-25 ENCOUNTER — Telehealth: Payer: Self-pay | Admitting: Occupational Therapy

## 2023-04-25 NOTE — Therapy (Signed)
OCCUPATIONAL THERAPY DISCHARGE SUMMARY  Visits from Start of Care: Evaluation + 2 visits  Current functional level related to goals / functional outcomes:  Pt has not had goals from evaluation 03/20/23 formally re-evaluated due to 3 No-Show appointments in a row.  Telephone contact with mother reveals less than satisfactory level of outcome ie) patient continues to complain regularly/daily about her hand hurting.  She is encouraged to follow up with MD as a new referral would be necessary due to DC due to noncompliance with attendance policy.  Remaining deficits: Pt has ongoing complaints of her hand hurting at home per mother's report.  Education / Equipment: Pt and caregiver have been provided education re: coordination activities to work on and Primary school teacher education ie) re: the drawstring bag patient regularly carries.  Patient had been observed to shake her right hand/arm after lifting it during therapy sessions and was noted to have multiple books in her bag, as well as 5 cans of room spray which made it very heavy for her to carry with her R hand.  Patient and mother were encouraged to removing items to decrease the weight of her bag, in order to protect her right arm and hand when lifting it throughout the day.    Patient's mother is made aware of discharge due to 3 missed visits (per attendance policy) and patient would need a new referral to be seen in the future.   Marylu Lund L. Judie Grieve, OTR/L 04/25/23

## 2023-04-25 NOTE — Telephone Encounter (Signed)
OTR was able to reach this patient's mother via telephone this afternoon re: 3 missed visits.  Mother reported that Nihira continues to complain about her hand hurting everyday.  She is encouraged to follow up with MD as a new referral will be needed s/p discharge due to noncompliance with attendance policy.  Marylu Lund L. Judie Grieve, OTR/L

## 2023-04-29 ENCOUNTER — Ambulatory Visit: Payer: Medicaid Other | Admitting: Occupational Therapy

## 2023-11-29 ENCOUNTER — Encounter (HOSPITAL_COMMUNITY): Payer: Self-pay | Admitting: Emergency Medicine

## 2023-11-29 ENCOUNTER — Ambulatory Visit (HOSPITAL_COMMUNITY)
Admission: EM | Admit: 2023-11-29 | Discharge: 2023-11-29 | Disposition: A | Discharge status: Home / Self Care | Payer: MEDICAID | Attending: Family Medicine | Admitting: Family Medicine

## 2023-11-29 DIAGNOSIS — Z87828 Personal history of other (healed) physical injury and trauma: Secondary | ICD-10-CM

## 2023-11-29 DIAGNOSIS — M79641 Pain in right hand: Secondary | ICD-10-CM

## 2023-11-29 DIAGNOSIS — L853 Xerosis cutis: Secondary | ICD-10-CM

## 2023-11-29 DIAGNOSIS — M79642 Pain in left hand: Secondary | ICD-10-CM

## 2023-11-29 MED ORDER — IBUPROFEN 100 MG/5ML PO SUSP: 400.0000 mg | Freq: Four times a day (QID) | ORAL | 0 refills | Status: AC

## 2023-11-29 MED ORDER — HYDROCORTISONE 1 % EX OINT: 1.0000 | TOPICAL_OINTMENT | Freq: Two times a day (BID) | CUTANEOUS | 0 refills | Status: AC
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
## 2024-11-01 ENCOUNTER — Ambulatory Visit
Admission: EM | Admit: 2024-11-01 | Discharge: 2024-11-01 | Disposition: A | Discharge status: Home / Self Care | Payer: MEDICAID

## 2024-11-01 ENCOUNTER — Encounter: Payer: Self-pay | Admitting: Emergency Medicine

## 2024-11-01 DIAGNOSIS — J069 Acute upper respiratory infection, unspecified: Secondary | ICD-10-CM | POA: Diagnosis not present

## 2024-11-01 LAB — POCT INFLUENZA A/B
Influenza A, POC: NEGATIVE
Influenza B, POC: NEGATIVE

## 2024-11-01 MED ORDER — BENZONATATE 100 MG PO CAPS: 100.0000 mg | ORAL_CAPSULE | Freq: Three times a day (TID) | ORAL | 0 refills | Status: AC

## 2024-11-01 MED ORDER — ALBUTEROL SULFATE HFA 108 (90 BASE) MCG/ACT IN AERS: 2.0000 | INHALATION_SPRAY | RESPIRATORY_TRACT | 0 refills | Status: AC | PRN

## 2024-11-01 NOTE — ED Triage Notes (Signed)
 Pt c/o productive cough x's 2 days  Also c/o sore throat and pain in her chest

## 2024-11-01 NOTE — ED Provider Notes (Signed)
 " [facility]    CSN: [id number] Arrival date & time: 11/01/24  1142      History   Chief Complaint Chief Complaint  Patient presents with   Cough    HPI Margaret Cooper is a 38 y.o. female.   Pt presents today due to cough productive of yellow sputum, throat pain, and chest tightness for persistent coughing for 2 days. Pt has not been using anything for symptoms. Pt does admit to being in contact with sick nephew. Pt states that she has been eating and drinking without issue.   The history is provided by the patient.  Cough   Past Medical History:  Diagnosis Date   ADD (attention deficit disorder with hyperactivity)    Asthma    seasonal - rarely uses inhaler   Bipolar disorder (HCC)    Depression    Down's syndrome    GERD (gastroesophageal reflux disease)    Mild mental retardation    OCD (obsessive compulsive disorder)    Seizures (HCC)    mother denies  never has had a seizure     Patient Active Problem List   Diagnosis Date Noted   Intellectual disability    Influenza A 10/20/2012   ARDS (adult respiratory distress syndrome) (HCC) 10/16/2012   Septic shock (HCC) 10/16/2012   Acute respiratory failure with hypoxia (HCC) 10/16/2012   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   Down's syndrome 09/21/2011    Past Surgical History:  Procedure Laterality Date   EXAMINATION UNDER ANESTHESIA  2008,2009   EXAMINATION UNDER ANESTHESIA  11/14/2011   Procedure: EXAM UNDER ANESTHESIA;  Surgeon: Gloris DELENA Hugger, MD;  Location: [facility];  Service: Gynecology;  Laterality: N/A;  Pap smear/down syndrome patient   TOOTH EXTRACTION  01/25/2012   Procedure: DENTAL RESTORATION/EXTRACTIONS;  Surgeon: Elsie FORBES Jeni Mickey., DDS;  Location: [facility];  Service: Oral Surgery;  Laterality: Bilateral;  recal exam, full mouth xrays, cleaning, T extraction, Two OL, Three DOL, Four O, Twelve O, Fourteen O, Fifteen O, Eighteen OF, Nineteen F, Twenty-eight F,     OB History      Gravida  0   Para  0   Term  0   Preterm  0   AB  0   Living         SAB  0   IAB  0   Ectopic  0   Multiple      Live Births               Home Medications    Prior to Admission medications  Medication Sig Start Date End Date Taking? Authorizing Provider  albuterol  (PROVENTIL  HFA;VENTOLIN  HFA) 108 (90 BASE) MCG/ACT inhaler Inhale 2 puffs into the lungs every 4 (four) hours as needed for wheezing or shortness of breath. 05/07/15   Tharon Lenis, NP  ARIPiprazole (ABILIFY) 10 MG tablet Take 10 mg by mouth daily. Patient not taking: Reported on 03/20/2023    [provider]  hydrocortisone  1 % ointment Apply 1 Application topically 2 (two) times daily. 11/29/23   Piontek, Rocky, MD  ibuprofen  100 MG/5ML suspension Take 20 mLs (400 mg total) by mouth every 6 (six) hours. 11/29/23   Piontek, Rocky, MD  naproxen  (NAPROSYN ) 375 MG tablet Take 1 tablet (375 mg total) by mouth 2 (two) times daily. Patient not taking: Reported on 03/20/2023 11/17/21   Raspet, Erin K, PA-C  ondansetron  (ZOFRAN ) 4 MG tablet Take 1 tablet (4 mg total) by mouth every  6 (six) hours. Patient not taking: Reported on 03/20/2023 10/06/22   Wedderburn, Ngozi N, NP  pantoprazole  (PROTONIX ) 40 MG tablet Take 1 tablet (40 mg total) by mouth daily. Patient not taking: Reported on 03/20/2023 05/12/21   Carita Senior, MD  Spacer/Aero-Holding Chambers (AEROCHAMBER PLUS) inhaler Use as instructed 05/07/15   Tharon Lenis, NP  Valproate Sodium  (VALPROIC  ACID PO) Take 20 mg by mouth 2 (two) times daily. Takes 20mg  in the morning and 30 mg at qhs Patient not taking: Reported on 03/20/2023    [provider]    Family History No family history on file.  Social History Social History[1]   Allergies   Levaquin  [levofloxacin  in d5w]   Review of Systems Review of Systems  Respiratory:  Positive for cough.      Physical Exam Triage Vital Signs ED Triage Vitals [11/01/24 1336]  Encounter Vitals  Group     BP (!) 90/51     Girls Systolic BP Percentile      Girls Diastolic BP Percentile      Boys Systolic BP Percentile      Boys Diastolic BP Percentile      Pulse Rate (!) 103     Resp 20     Temp (!) 97.2 F (36.2 C)     Temp Source Oral     SpO2 98 %     Weight      Height      Head Circumference      Peak Flow      Pain Score      Pain Loc      Pain Education      Exclude from Growth Chart    No data found.  Updated Vital Signs BP 95/63 (BP Location: Left Arm)   Pulse (!) 103   Temp (!) 97.2 F (36.2 C) (Oral)   Resp 20   LMP 10/25/2024 (Exact Date)   SpO2 98%   Visual Acuity Right Eye Distance:   Left Eye Distance:   Bilateral Distance:    Right Eye Near:   Left Eye Near:    Bilateral Near:     Physical Exam Vitals and nursing note reviewed.  Constitutional:      General: She is not in acute distress.    Appearance: Normal appearance. She is not ill-appearing, toxic-appearing or diaphoretic.  HENT:     Nose: Congestion (mildly enlarged turbinates) present. No rhinorrhea.     Mouth/Throat:     Mouth: Mucous membranes are moist.     Pharynx: Oropharynx is clear. No oropharyngeal exudate or posterior oropharyngeal erythema.  Eyes:     General: No scleral icterus. Cardiovascular:     Rate and Rhythm: Normal rate and regular rhythm.     Heart sounds: Normal heart sounds.  Pulmonary:     Effort: Pulmonary effort is normal. No respiratory distress.     Breath sounds: Normal breath sounds. No wheezing or rhonchi.  Skin:    General: Skin is warm.  Neurological:     Mental Status: She is alert and oriented to person, place, and time.  Psychiatric:        Mood and Affect: Mood normal.        Behavior: Behavior normal.      UC Treatments / Results  Labs (all labs ordered are listed, but only abnormal results are displayed) Labs Reviewed  POCT INFLUENZA A/B    EKG   Radiology No results found.  Procedures Procedures (including critical  care time)  Medications Ordered in UC Medications - No data to display  Initial Impression / Assessment and Plan / UC Course  I have reviewed the triage vital signs and the nursing notes.  Pertinent labs & imaging results that were available during my care of the patient were reviewed by me and considered in my medical decision making (see chart for details).     Final Clinical Impressions(s) / UC Diagnoses   Final diagnoses:  Viral URI   Discharge Instructions   None    ED Prescriptions   None    PDMP not reviewed this encounter.    [1]  Social History Tobacco Use   Smoking status: Never   Smokeless tobacco: Never  Vaping Use   Vaping status: Never Used  Substance Use Topics   Alcohol use: No   Drug use: No     Andra Corean BROCKS, PA-C 11/01/24 1423  "

## 2024-11-01 NOTE — Discharge Instructions (Addendum)
